# Patient Record
Sex: Female | Born: 1950 | Race: White | Hispanic: No | Smoking: Former smoker
Health system: Southern US, Community
[De-identification: ages and names within clinical notes are randomized; demographics above are authoritative.]

## PROBLEM LIST (undated history)

## (undated) DIAGNOSIS — E039 Hypothyroidism, unspecified: Secondary | ICD-10-CM

## (undated) DIAGNOSIS — R202 Paresthesia of skin: Secondary | ICD-10-CM

## (undated) DIAGNOSIS — R918 Other nonspecific abnormal finding of lung field: Secondary | ICD-10-CM

## (undated) DIAGNOSIS — R7309 Other abnormal glucose: Secondary | ICD-10-CM

## (undated) DIAGNOSIS — E785 Hyperlipidemia, unspecified: Secondary | ICD-10-CM

## (undated) DIAGNOSIS — I10 Essential (primary) hypertension: Secondary | ICD-10-CM

## (undated) DIAGNOSIS — E7800 Pure hypercholesterolemia, unspecified: Secondary | ICD-10-CM

## (undated) DIAGNOSIS — M810 Age-related osteoporosis without current pathological fracture: Secondary | ICD-10-CM

## (undated) HISTORY — DX: Other abnormal glucose: R73.09

## (undated) HISTORY — DX: Other nonspecific abnormal finding of lung field: R91.8

## (undated) HISTORY — DX: Hyperlipidemia, unspecified: E78.5

## (undated) HISTORY — DX: Paresthesia of skin: R20.2

## (undated) HISTORY — DX: Hypothyroidism, unspecified: E03.9

---

## 2018-06-17 ENCOUNTER — Encounter (HOSPITAL_COMMUNITY): Payer: Self-pay | Admitting: *Deleted

## 2018-06-17 ENCOUNTER — Other Ambulatory Visit: Payer: Self-pay

## 2018-06-17 ENCOUNTER — Emergency Department (HOSPITAL_COMMUNITY): Payer: Medicare Other

## 2018-06-17 ENCOUNTER — Emergency Department (HOSPITAL_COMMUNITY)
Admission: EM | Admit: 2018-06-17 | Discharge: 2018-06-17 | Disposition: A | Discharge status: Home / Self Care | Payer: Medicare Other | Attending: Emergency Medicine | Admitting: Emergency Medicine

## 2018-06-17 DIAGNOSIS — Z87891 Personal history of nicotine dependence: Secondary | ICD-10-CM | POA: Insufficient documentation

## 2018-06-17 DIAGNOSIS — Z8639 Personal history of other endocrine, nutritional and metabolic disease: Secondary | ICD-10-CM | POA: Diagnosis not present

## 2018-06-17 DIAGNOSIS — Z79899 Other long term (current) drug therapy: Secondary | ICD-10-CM | POA: Insufficient documentation

## 2018-06-17 DIAGNOSIS — I10 Essential (primary) hypertension: Secondary | ICD-10-CM | POA: Insufficient documentation

## 2018-06-17 DIAGNOSIS — H538 Other visual disturbances: Secondary | ICD-10-CM | POA: Diagnosis present

## 2018-06-17 DIAGNOSIS — H539 Unspecified visual disturbance: Secondary | ICD-10-CM

## 2018-06-17 HISTORY — DX: Age-related osteoporosis without current pathological fracture: M81.0

## 2018-06-17 HISTORY — DX: Pure hypercholesterolemia, unspecified: E78.00

## 2018-06-17 HISTORY — DX: Essential (primary) hypertension: I10

## 2018-06-17 LAB — COMPREHENSIVE METABOLIC PANEL
ALT: 20 U/L (ref 0–44)
ANION GAP: 11 (ref 5–15)
AST: 26 U/L (ref 15–41)
Albumin: 4.2 g/dL (ref 3.5–5.0)
Alkaline Phosphatase: 49 U/L (ref 38–126)
BUN: 18 mg/dL (ref 8–23)
CHLORIDE: 103 mmol/L (ref 98–111)
CO2: 27 mmol/L (ref 22–32)
Calcium: 9.2 mg/dL (ref 8.9–10.3)
Creatinine, Ser: 0.92 mg/dL (ref 0.44–1.00)
Glucose, Bld: 91 mg/dL (ref 70–99)
POTASSIUM: 3.2 mmol/L — AB (ref 3.5–5.1)
Sodium: 141 mmol/L (ref 135–145)
TOTAL PROTEIN: 7.1 g/dL (ref 6.5–8.1)
Total Bilirubin: 0.6 mg/dL (ref 0.3–1.2)

## 2018-06-17 LAB — DIFFERENTIAL
BASOS ABS: 0 10*3/uL (ref 0.0–0.1)
BASOS PCT: 0 %
EOS ABS: 0 10*3/uL (ref 0.0–0.7)
EOS PCT: 1 %
Lymphocytes Relative: 35 %
Lymphs Abs: 2.5 10*3/uL (ref 0.7–4.0)
MONO ABS: 0.5 10*3/uL (ref 0.1–1.0)
MONOS PCT: 7 %
Neutro Abs: 4.1 10*3/uL (ref 1.7–7.7)
Neutrophils Relative %: 57 %

## 2018-06-17 LAB — CBC
HEMATOCRIT: 40.3 % (ref 36.0–46.0)
Hemoglobin: 14.4 g/dL (ref 12.0–15.0)
MCH: 31.7 pg (ref 26.0–34.0)
MCHC: 35.7 g/dL (ref 30.0–36.0)
MCV: 88.8 fL (ref 78.0–100.0)
Platelets: 242 10*3/uL (ref 150–400)
RBC: 4.54 MIL/uL (ref 3.87–5.11)
RDW: 12.4 % (ref 11.5–15.5)
WBC: 7.1 10*3/uL (ref 4.0–10.5)

## 2018-06-17 LAB — I-STAT TROPONIN, ED: TROPONIN I, POC: 0 ng/mL (ref 0.00–0.08)

## 2018-06-17 LAB — PROTIME-INR
INR: 0.94
Prothrombin Time: 12.5 seconds (ref 11.4–15.2)

## 2018-06-17 LAB — APTT: APTT: 29 s (ref 24–36)

## 2018-06-17 MED ORDER — SODIUM CHLORIDE 0.9 % IV BOLUS
500.0000 mL | Freq: Once | INTRAVENOUS | Status: AC
  Administered 2018-06-17: 500 mL via INTRAVENOUS

## 2018-06-17 MED ORDER — SODIUM CHLORIDE 0.9 % IV SOLN
100.0000 mL/h | INTRAVENOUS | Status: DC
  Administered 2018-06-17: 100 mL/h via INTRAVENOUS

## 2018-06-17 NOTE — ED Triage Notes (Signed)
Pt stated "Around 3 pm, I bent over and when I raised back up my vision was blurred.  I took my b/p, it was high, so I took it again and it was normal."  Pt denies LOC, numbness, tingling, or extremity deficits.  Facial symmetry normal.

## 2018-06-17 NOTE — ED Notes (Signed)
Patient transported to X-ray 

## 2018-06-17 NOTE — Discharge Instructions (Addendum)
As discussed, today's evaluation has been generally reassuring, but with consideration of possible changes in your condition, it is important he follow-up with your physician, via telephone tomorrow to arrange outpatient studies to minimize stroke risk.  These may include echocardiogram and carotid ultrasound.  In the interim, please take aspirin, 81 mg, daily.  Return here for concerning changes in your condition.

## 2018-06-17 NOTE — ED Provider Notes (Signed)
[facility]-EMERGENCY DEPT Provider Note   CSN: [id number] Arrival date & time: 06/17/18  1904     History   Chief Complaint Chief Complaint  Patient presents with  . visual changes    HPI Kari Lambert is a 67 y.o. female.  HPI   Patient presents after 2 episodes of vision changes. Patient has no history of stroke, MI, has had no prior CT, nor carotid ultrasound. Today, after bending over to perform some work, she felt onset of blurry vision, when she returned to upright positioning. This lasted a few moments, resolved. Afterwards she had a brief sensation of left peripheral blurry vision as well, this also resolved prior to ED arrival. Beyond the positioning, there was no clear precipitant for these changes, and she denies any other concurrent changes from baseline including weakness in her extremities, confusion, nausea. 1 recent medication change, initiation of anti-cholesterol medication, otherwise no medicine, diet, activity changes. 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 She is here with her mother who assists with the HPI.  Past Medical History:  Diagnosis Date  . High cholesterol   . Hypertension   . Osteoporosis     There are no active problems to display for this patient.   History reviewed. No pertinent surgical history.   OB History   None      Home Medications    Prior to Admission medications   Medication Sig Start Date End Date Taking? Authorizing Provider  alendronate (FOSAMAX) 70 MG tablet Take 70 mg by mouth every Saturday. 05/20/15  Yes [provider]  hydrochlorothiazide (HYDRODIURIL) 25 MG tablet Take 25 mg by mouth daily. 05/25/15  Yes [provider]  Multiple Vitamins-Minerals (CENTRUM SILVER PO) Take 1 tablet by mouth at bedtime.   Yes [provider]  potassium chloride (K-DUR) 10 MEQ tablet Take 10 mEq by mouth 2 (two)  times daily. 05/15/18  Yes [provider]  rosuvastatin (CRESTOR) 10 MG tablet Take 10 mg by mouth daily. 06/15/18  Yes [provider]    Family History No family history on file.  Social History Social History   Tobacco Use  . Smoking status: Former Smoker  Substance Use Topics  . Alcohol use: Yes    Alcohol/week: 7.0 standard drinks    Types: 7 Glasses of wine per week  . Drug use: Never     Allergies   Contrast media  [iodinated diagnostic agents]   Review of Systems Review of Systems  Constitutional:       Per HPI, otherwise negative  HENT:       Per HPI, otherwise negative  Eyes: Positive for visual disturbance.  Respiratory:       Per HPI, otherwise negative  Cardiovascular:       Per HPI, otherwise negative  Gastrointestinal: Negative for vomiting.  Endocrine:       Negative aside from HPI  Genitourinary:       Neg aside from HPI   Musculoskeletal:       Per HPI, otherwise negative  Skin: Negative.   Neurological: Negative for syncope.     Physical Exam Updated Vital Signs BP (!) 153/88 (BP Location: Left Arm)   Pulse 75   Temp 98.2 F (36.8 C)   Resp 15   Ht 5\' 4"  (1.626 m)   Wt 67.6 kg   SpO2 100%   BMI 25.58 kg/m   Physical Exam  Constitutional: She  is oriented to person, place, and time. She appears well-developed and well-nourished. No distress.  HENT:  Head: Normocephalic and atraumatic.  Eyes: Conjunctivae and EOM are normal.  Cardiovascular: Normal rate and regular rhythm.  Pulmonary/Chest: Effort normal and breath sounds normal. No stridor. No respiratory distress.  Abdominal: She exhibits no distension.  Musculoskeletal: She exhibits no edema.  Neurological: She is alert and oriented to person, place, and time. She displays no atrophy and no tremor. No cranial nerve deficit or sensory deficit. She exhibits normal muscle tone. She displays no seizure activity. Coordination normal.  Skin: Skin is warm and dry.    Psychiatric: She has a normal mood and affect.  Nursing note and vitals reviewed.    ED Treatments / Results  Labs (all labs ordered are listed, but only abnormal results are displayed) Labs Reviewed  COMPREHENSIVE METABOLIC PANEL - Abnormal; Notable for the following components:      Result Value   Potassium 3.2 (*)    All other components within normal limits  PROTIME-INR  APTT  CBC  DIFFERENTIAL  RAPID URINE DRUG SCREEN, HOSP PERFORMED  URINALYSIS, ROUTINE W REFLEX MICROSCOPIC  I-STAT TROPONIN, ED    EKG EKG Interpretation  Date/Time:  Sunday June 17 2018 19:11:13 EDT Ventricular Rate:  68 PR Interval:    QRS Duration: 101 QT Interval:  391 QTC Calculation: 416 R Axis:   67 Text Interpretation:  Sinus rhythm Short PR interval Low voltage, extremity and precordial leads Artifact ST-t wave abnormality Abnormal ekg Confirmed by Gerhard MunchLockwood, Ami Thornsberry [id number]) on 06/17/2018 7:20:44 PM   Radiology Dg Chest 2 View  Result Date: 06/17/2018 CLINICAL DATA:  Cough, altered mental status EXAM: CHEST - 2 VIEW COMPARISON:  None. FINDINGS: The heart size and mediastinal contours are within normal limits. Both lungs are clear. The visualized skeletal structures are unremarkable. IMPRESSION: No active cardiopulmonary disease. Electronically Signed   By: Jasmine PangKim  Fujinaga M.D.   On: 06/17/2018 20:53   Ct Head Wo Contrast  Result Date: 06/17/2018 CLINICAL DATA:  Hypertension with blurred vision EXAM: CT HEAD WITHOUT CONTRAST TECHNIQUE: Contiguous axial images were obtained from the base of the skull through the vertex without intravenous contrast. COMPARISON:  None. FINDINGS: Brain: There is age related volume loss. There is no intracranial mass, hemorrhage, extra-axial fluid collection, or midline shift. Gray-white compartments appear normal. No evident acute infarct. Vascular: No evident hyperdense vessel. There is slight calcification in the distal left cavernous carotid artery. Skull: Bony  calvarium appears intact. Sinuses/Orbits: There is slight mucosal thickening in several ethmoid air cells. Other visualized paranasal sinuses are clear. There is rightward deviation of the nasal septum. Orbits bilaterally appear symmetric. Other: Mastoid air cells are clear. IMPRESSION: Age related volume loss. Gray-white compartments are normal. No mass or hemorrhage. There is slight arterial vascular calcification. There is mucosal thickening in several ethmoid air cells. There is rightward deviation of the nasal septum. Electronically Signed   By: Bretta BangWilliam  Woodruff III M.D.   On: 06/17/2018 21:20    Procedures Procedures (including critical care time)  Medications Ordered in ED Medications  sodium chloride 0.9 % bolus 500 mL (500 mLs Intravenous New Bag/Given 06/17/18 2046)    Followed by  0.9 %  sodium chloride infusion (100 mL/hr Intravenous New Bag/Given 06/17/18 2049)     Initial Impression / Assessment and Plan / ED Course  I have reviewed the triage vital signs and the nursing notes.  Pertinent labs & imaging results that were available during my care  of the patient were reviewed by me and considered in my medical decision making (see chart for details).     9:50 PM Awake, alert, in no distress, no recurrence of her vision changes. With a lengthy conversation about today's evaluation including reassuring CT scan, x-ray, labs. No evidence for ongoing ischemia, no evidence for stroke. Patient does have some age-appropriate changes.  We discussed today's evaluation as a step for stroke evaluation, and need for ongoing identification of stroke risk factors, which could be done as an outpatient or with escalation for possible consideration of TIA. This is a low suspicion, and symptoms are more likely due to the patient's positioning, however, with no prior evaluation including carotid Dopplers, echo, patient will follow up with her primary care physician to perform these studies. In the  interim, patient will start low-dose aspirin.   Final Clinical Impressions(s) / ED Diagnoses  Visual disturbance   Gerhard MunchLockwood, [place] Howse, MD 06/17/18 2151

## 2018-06-21 ENCOUNTER — Other Ambulatory Visit (HOSPITAL_COMMUNITY): Payer: Self-pay | Admitting: Family Medicine

## 2018-06-21 DIAGNOSIS — R42 Dizziness and giddiness: Secondary | ICD-10-CM

## 2018-06-22 ENCOUNTER — Other Ambulatory Visit: Payer: Self-pay | Admitting: Family Medicine

## 2018-06-22 DIAGNOSIS — R42 Dizziness and giddiness: Secondary | ICD-10-CM

## 2018-06-26 ENCOUNTER — Other Ambulatory Visit: Payer: Self-pay

## 2018-06-26 ENCOUNTER — Ambulatory Visit (HOSPITAL_COMMUNITY): Payer: Medicare Other | Attending: Cardiovascular Disease

## 2018-06-26 DIAGNOSIS — E785 Hyperlipidemia, unspecified: Secondary | ICD-10-CM | POA: Insufficient documentation

## 2018-06-26 DIAGNOSIS — R42 Dizziness and giddiness: Secondary | ICD-10-CM | POA: Insufficient documentation

## 2018-06-26 DIAGNOSIS — I10 Essential (primary) hypertension: Secondary | ICD-10-CM | POA: Diagnosis not present

## 2018-06-27 ENCOUNTER — Ambulatory Visit
Admission: RE | Admit: 2018-06-27 | Discharge: 2018-06-27 | Disposition: A | Discharge status: Home / Self Care | Payer: Medicare Other | Source: Ambulatory Visit | Attending: Family Medicine | Admitting: Family Medicine

## 2018-06-27 DIAGNOSIS — R42 Dizziness and giddiness: Secondary | ICD-10-CM

## 2019-05-09 IMAGING — US US CAROTID DUPLEX BILAT
1 series · 13 of 24 positions shown · non-contrast
Comparison: Head CT 06/17/2018

CLINICAL DATA: 66-year-old female with dizziness

EXAM:
BILATERAL CAROTID DUPLEX ULTRASOUND
TECHNIQUE: Gray scale imaging, color Doppler and duplex ultrasound were
performed of bilateral carotid and vertebral arteries in the neck.

[Series 1: us carotid duplex bilat · 0.06mm/px · 13 of 68 slices shown]
[im 1/68]
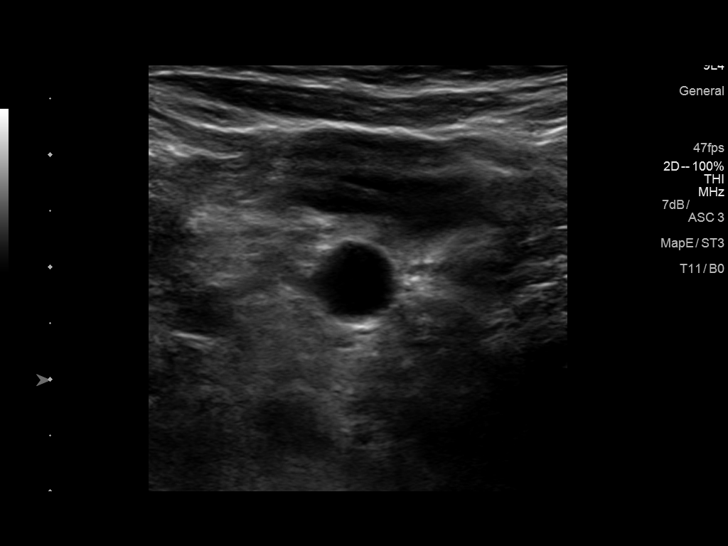
[im 6/68]
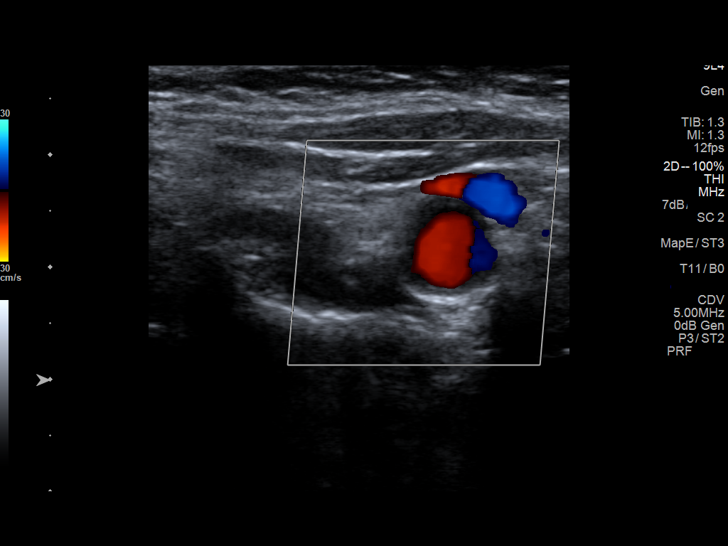
[im 12/68]
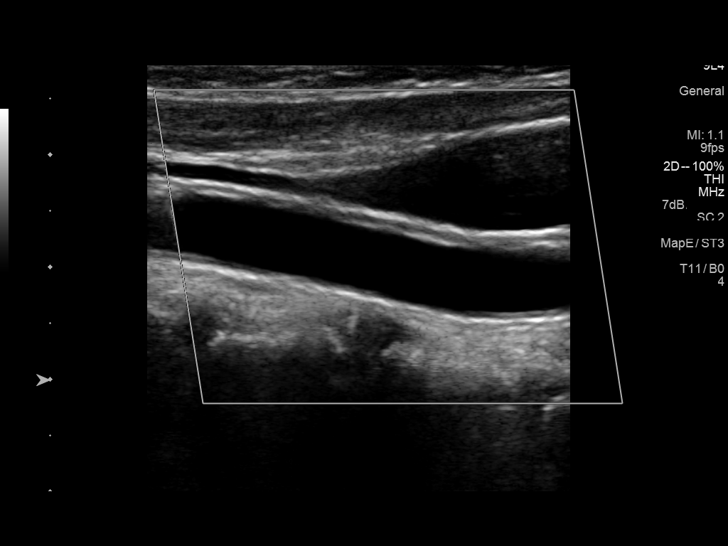
[im 18/68]
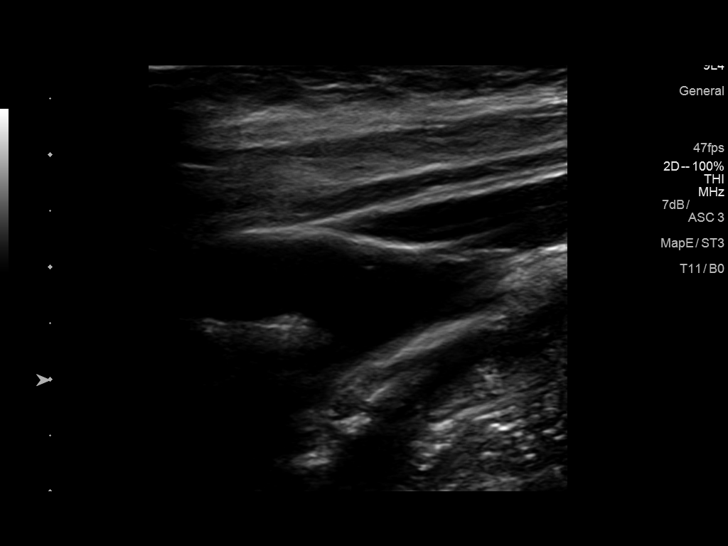
[im 24/68]
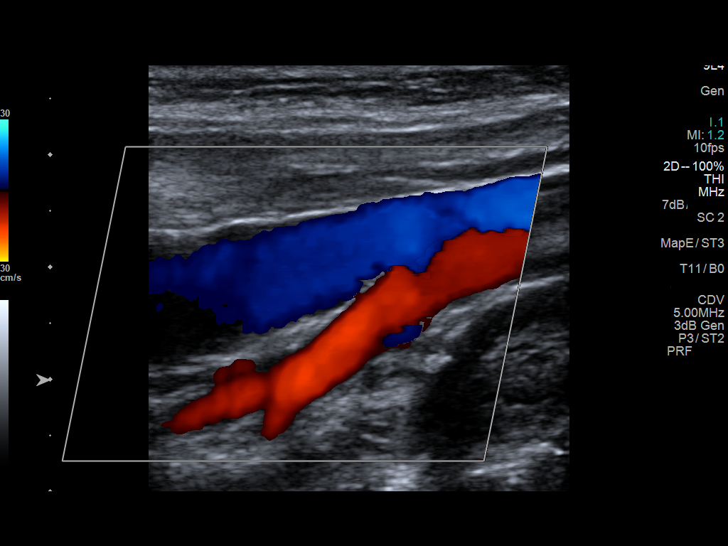
[im 30/68]
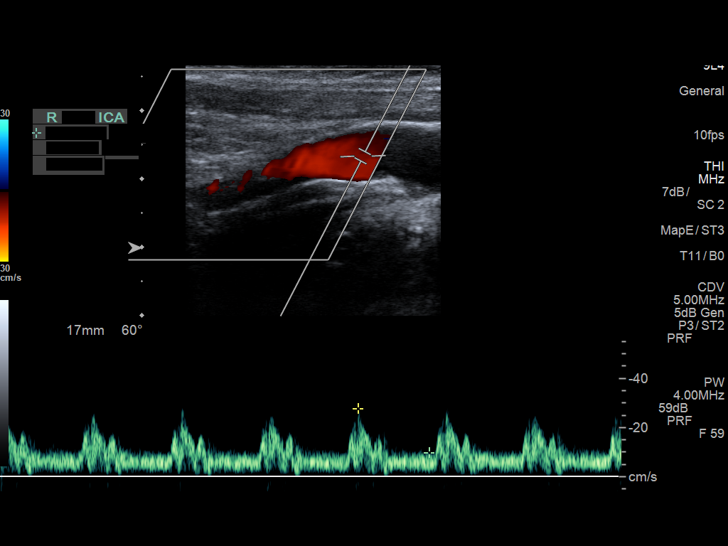
[im 35/68]
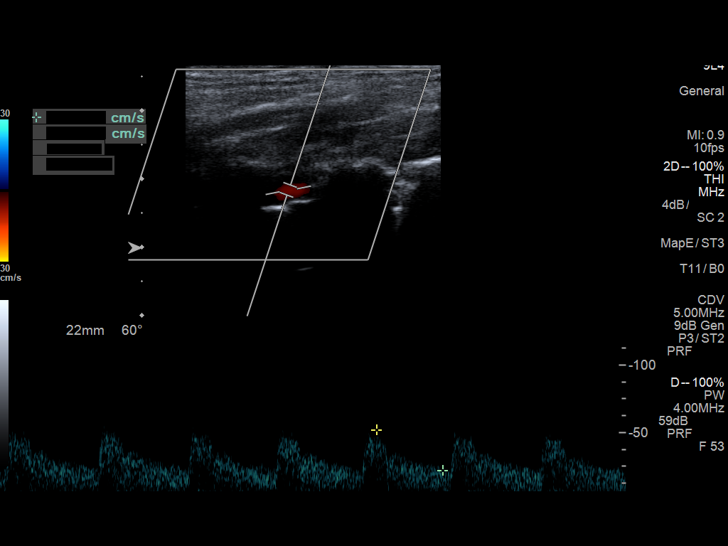
[im 38/68]
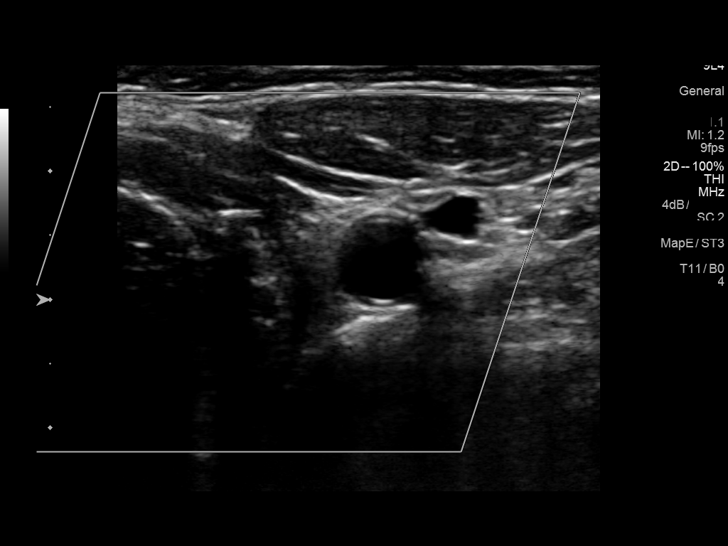
[im 44/68]
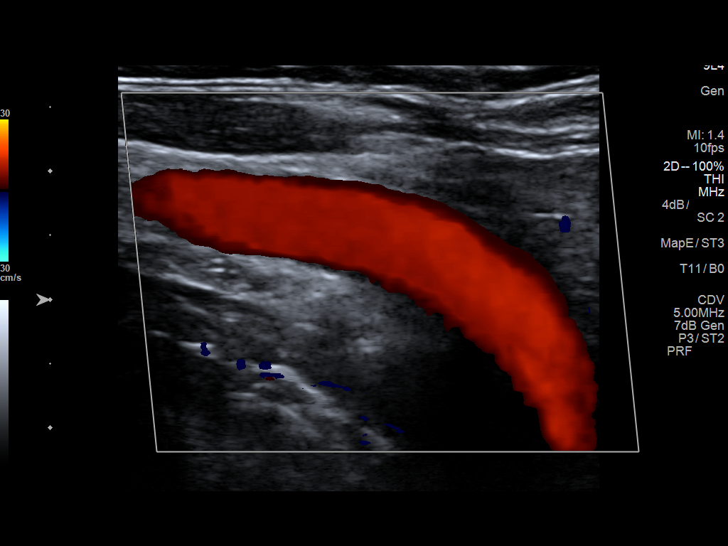
[im 50/68]
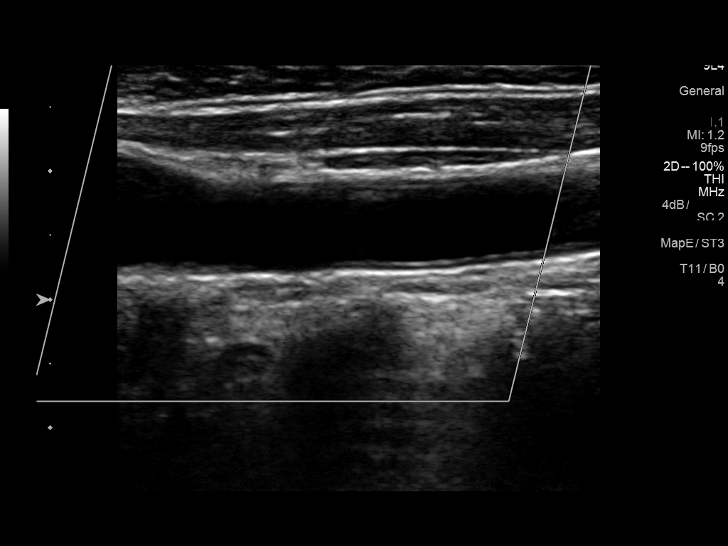
[im 56/68]
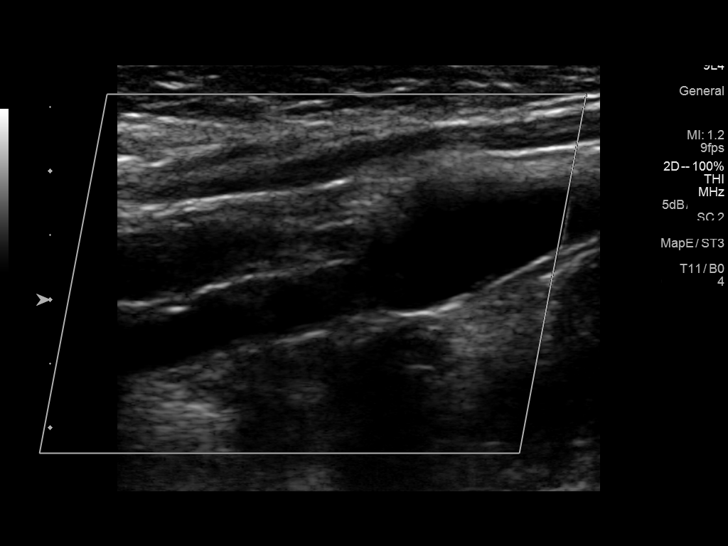
[im 62/68]
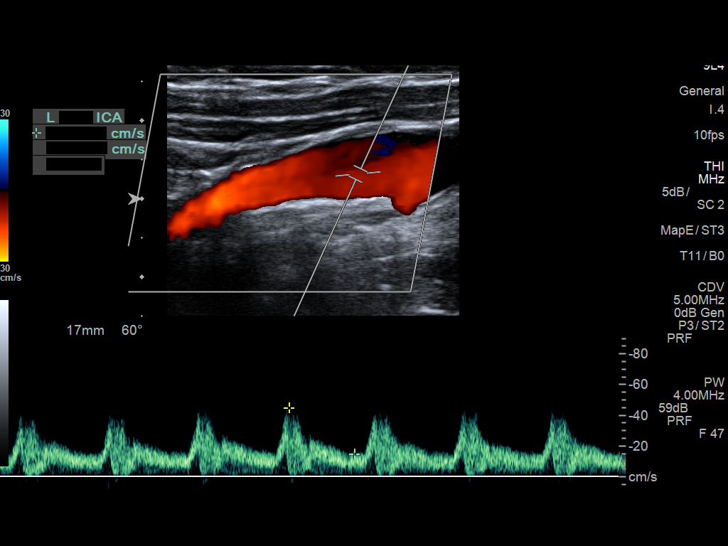
[im 68/68]
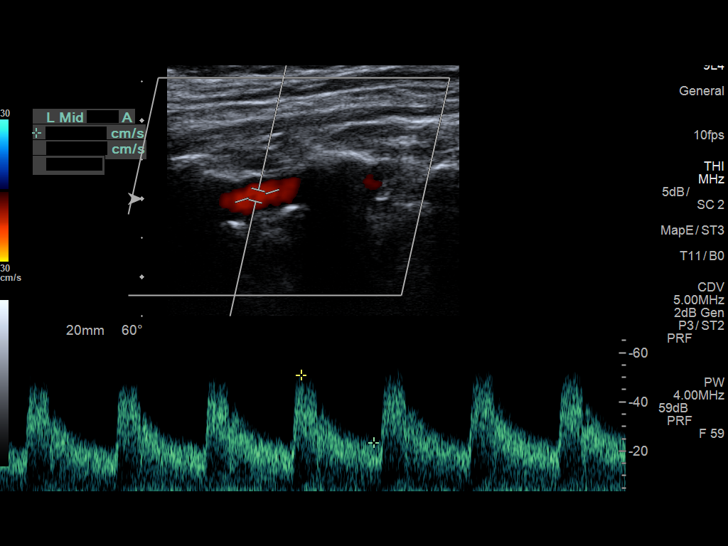

[13 of 24 positions shown; findings below may reference images not displayed]

FINDINGS: Criteria: Quantification of carotid stenosis is based on velocity
parameters that correlate the residual internal carotid diameter
with NASCET-based stenosis levels, using the diameter of the distal
internal carotid lumen as the denominator for stenosis measurement.

The following velocity measurements were obtained:

RIGHT
ICA: 79/44 cm/sec
CCA: 91/21 cm/sec

SYSTOLIC ICA/CCA RATIO:

ECA:  55 cm/sec

LEFT

ICA: 82/38 cm/sec

CCA: 89/22 cm/sec

SYSTOLIC ICA/CCA RATIO:

ECA:  59 cm/sec

RIGHT CAROTID ARTERY: Mild smooth hypoechoic atherosclerotic plaque
in the proximal internal carotid artery. By peak systolic velocity
criteria, the estimated stenosis remains less than 50%.

RIGHT VERTEBRAL ARTERY:  Patent with normal antegrade flow.

LEFT CAROTID ARTERY: No significant atherosclerotic plaque or
evidence of stenosis.

LEFT VERTEBRAL ARTERY:  Patent with normal antegrade flow.
IMPRESSION: 1. Mild (1-49%) stenosis proximal right internal carotid artery
secondary to mild smooth hypoechoic (lipid rich) atherosclerotic
plaque.
2. No significant atherosclerotic plaque or evidence of stenosis in
the left internal carotid artery.
3. Vertebral arteries are patent with normal antegrade flow.

## 2020-05-28 IMAGING — CT CT HEAD W/O CM
3 series · 14 of 47 positions shown, 16 images · non-contrast
Comparison: None.

CLINICAL DATA: Hypertension with blurred vision

EXAM:
CT HEAD WITHOUT CONTRAST
TECHNIQUE: Contiguous axial images were obtained from the base of the skull
through the vertex without intravenous contrast.

[Series 2: head wo · axial · 0.47mm/px · z∈[+31,+166]mm · 8 of 33 slices shown, 10 images]
[im 3/33  brain]
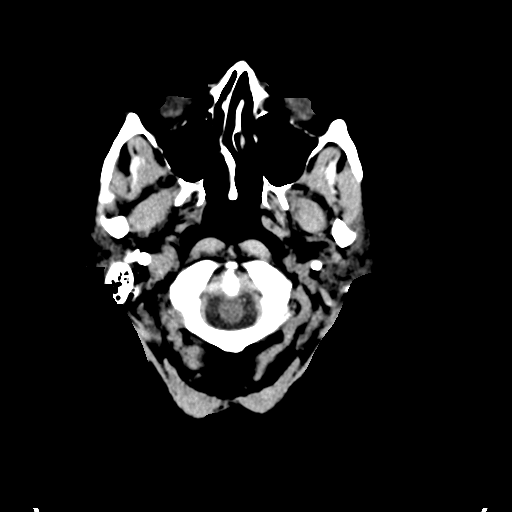
[im 3/33  bone]
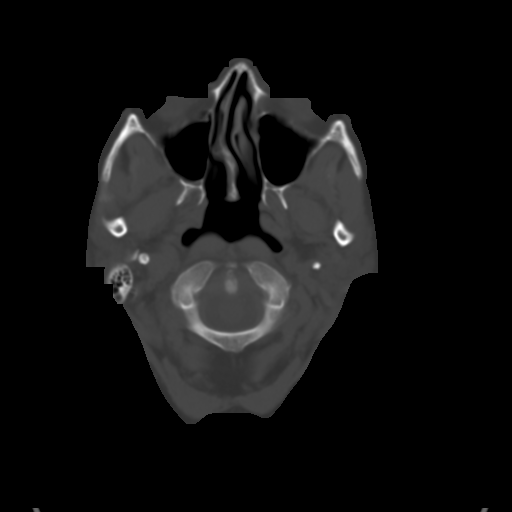
[im 7/33  brain]
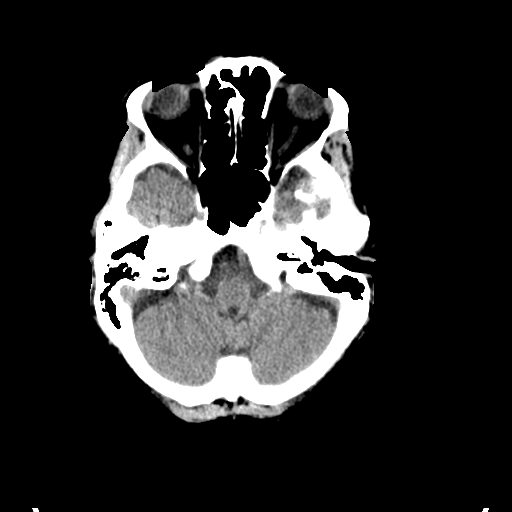
[im 10/33  brain]
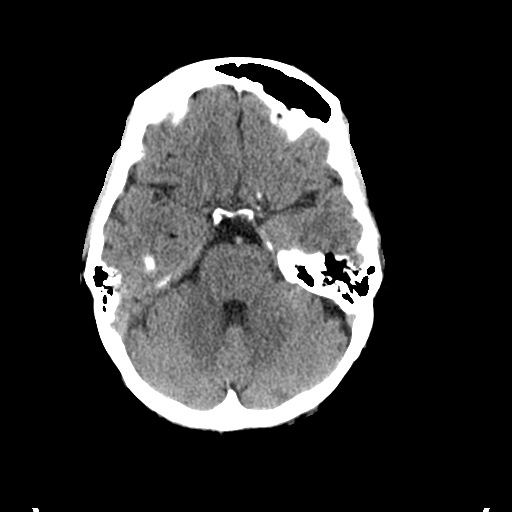
[im 15/33  brain]
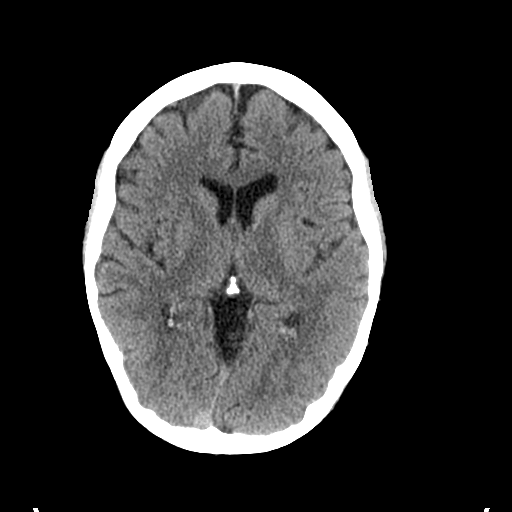
[im 18/33  brain]
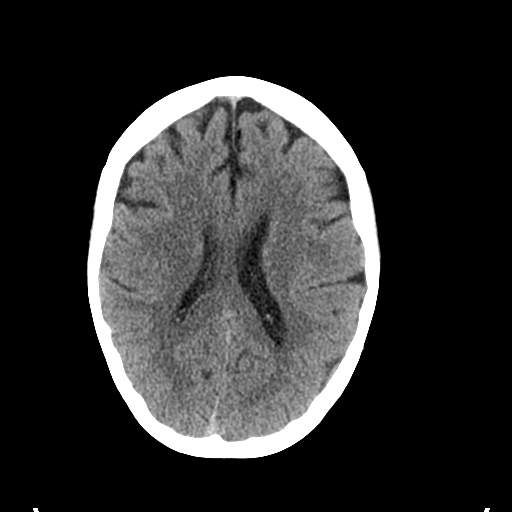
[im 18/33  bone]
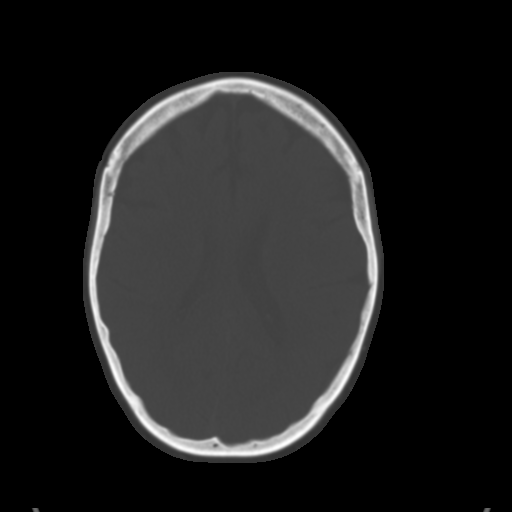
[im 23/33  brain]
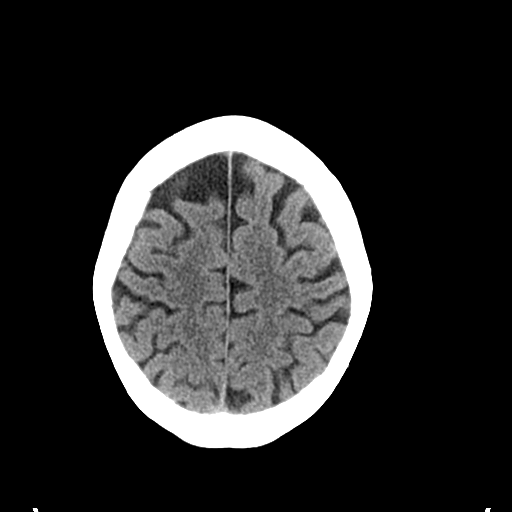
[im 26/33  brain]
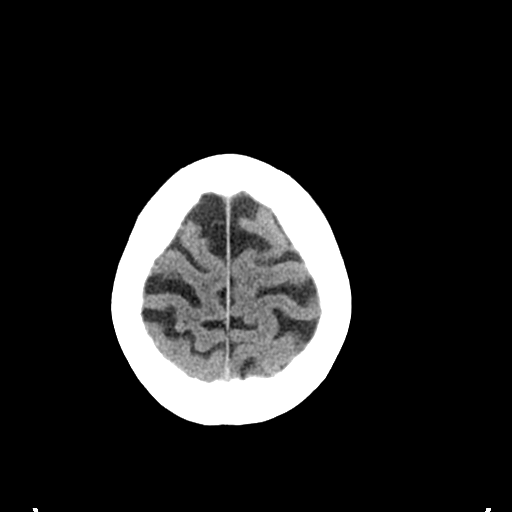
[im 30/33  brain]
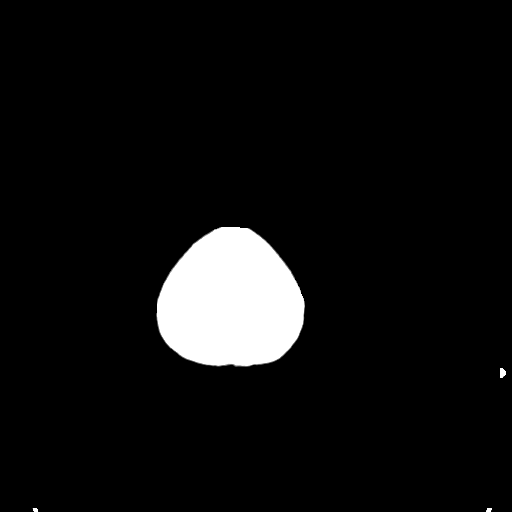

[Series 4: coronal soft tissue · coronal · 0.32mm/px · 3 of 84 slices shown]
[im 28/84  brain]
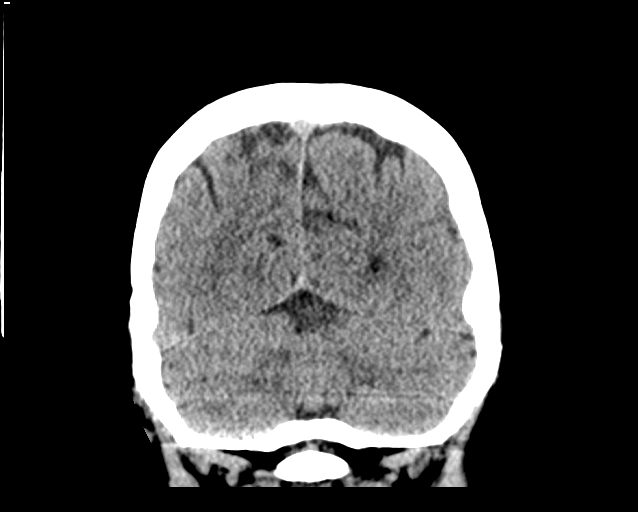
[im 37/84  brain]
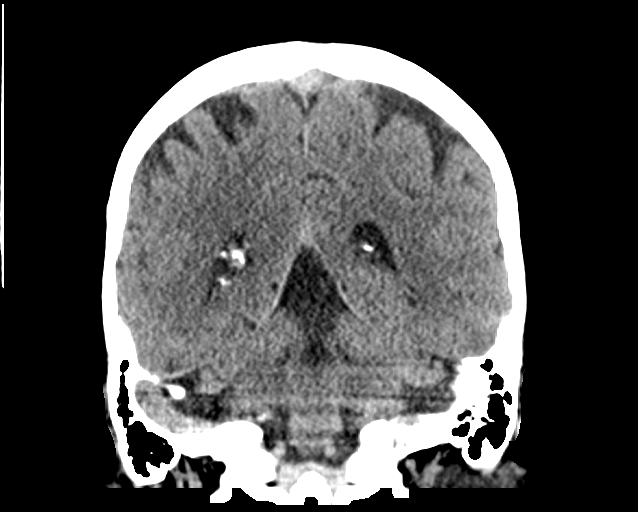
[im 47/84  brain]
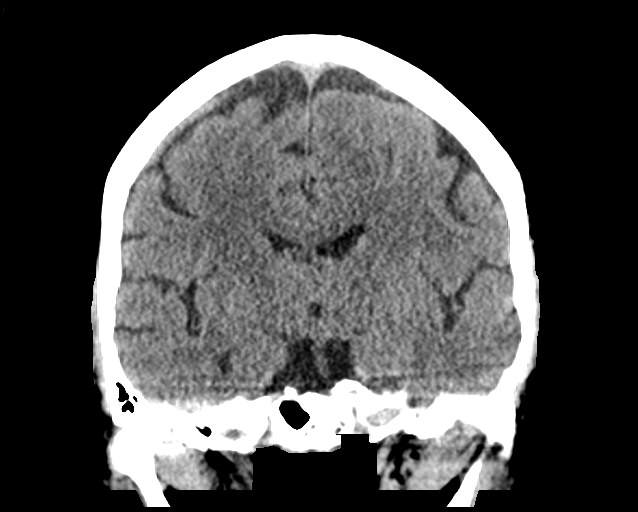

[Series 5: sagittal soft tissue · sagittal · 0.32mm/px · 3 of 70 slices shown]
[im 24/70  brain]
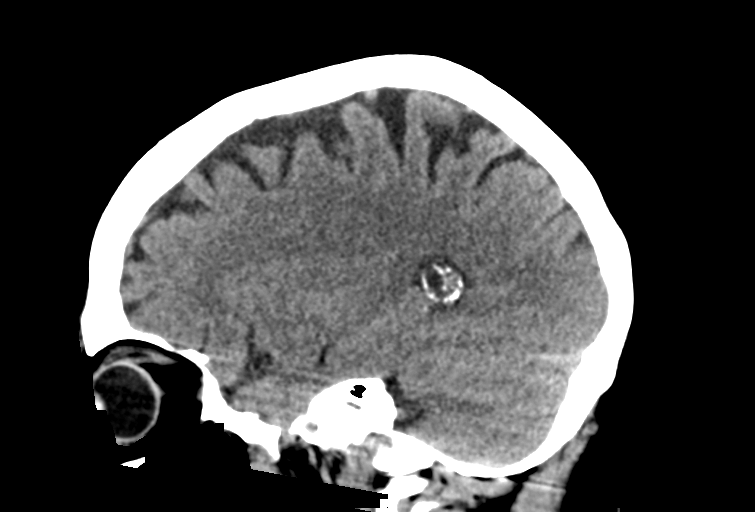
[im 35/70  brain]
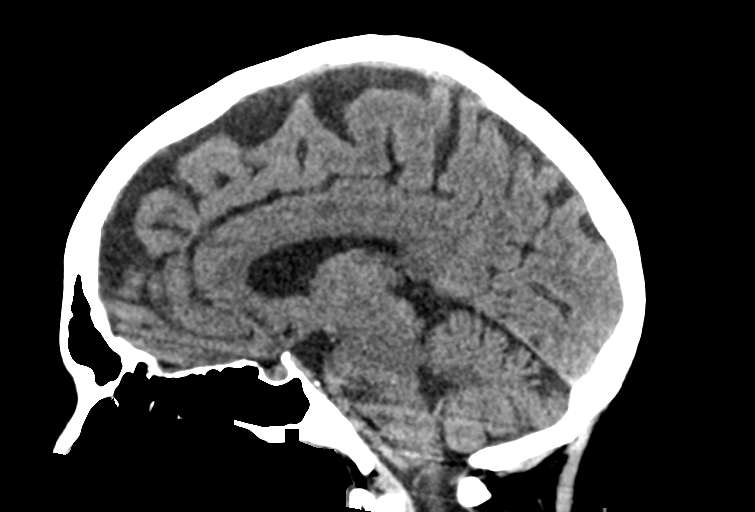
[im 47/70  brain]
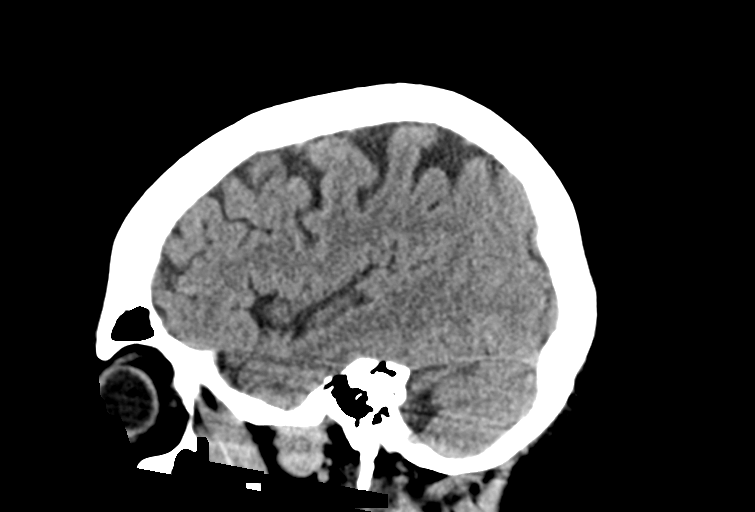

[14 of 47 positions shown; findings below may reference images not displayed]

FINDINGS: Brain: There is age related volume loss. There is no intracranial
mass, hemorrhage, extra-axial fluid collection, or midline shift.
Gray-white compartments appear normal. No evident acute infarct.

Vascular: No evident hyperdense vessel. There is slight
calcification in the distal left cavernous carotid artery.

Skull: Bony calvarium appears intact.

Sinuses/Orbits: There is slight mucosal thickening in several
ethmoid air cells. Other visualized paranasal sinuses are clear.
There is rightward deviation of the nasal septum. Orbits bilaterally
appear symmetric.

Other: Mastoid air cells are clear.
IMPRESSION: Age related volume loss. Gray-white compartments are normal. No mass
or hemorrhage.

There is slight arterial vascular calcification. There is mucosal
thickening in several ethmoid air cells. There is rightward
deviation of the nasal septum.

## 2020-07-15 DIAGNOSIS — H40013 Open angle with borderline findings, low risk, bilateral: Secondary | ICD-10-CM | POA: Diagnosis not present

## 2020-07-15 DIAGNOSIS — H04123 Dry eye syndrome of bilateral lacrimal glands: Secondary | ICD-10-CM | POA: Diagnosis not present

## 2020-07-15 DIAGNOSIS — H25013 Cortical age-related cataract, bilateral: Secondary | ICD-10-CM | POA: Diagnosis not present

## 2020-07-15 DIAGNOSIS — H2513 Age-related nuclear cataract, bilateral: Secondary | ICD-10-CM | POA: Diagnosis not present

## 2020-07-21 DIAGNOSIS — E785 Hyperlipidemia, unspecified: Secondary | ICD-10-CM | POA: Diagnosis not present

## 2020-07-21 DIAGNOSIS — Z23 Encounter for immunization: Secondary | ICD-10-CM | POA: Diagnosis not present

## 2020-07-21 DIAGNOSIS — Z1283 Encounter for screening for malignant neoplasm of skin: Secondary | ICD-10-CM | POA: Diagnosis not present

## 2020-07-21 DIAGNOSIS — L989 Disorder of the skin and subcutaneous tissue, unspecified: Secondary | ICD-10-CM | POA: Diagnosis not present

## 2020-07-21 DIAGNOSIS — R202 Paresthesia of skin: Secondary | ICD-10-CM | POA: Diagnosis not present

## 2020-07-21 DIAGNOSIS — M810 Age-related osteoporosis without current pathological fracture: Secondary | ICD-10-CM | POA: Diagnosis not present

## 2020-07-21 DIAGNOSIS — Z0000 Encounter for general adult medical examination without abnormal findings: Secondary | ICD-10-CM | POA: Diagnosis not present

## 2020-07-21 DIAGNOSIS — R7309 Other abnormal glucose: Secondary | ICD-10-CM | POA: Diagnosis not present

## 2020-07-21 DIAGNOSIS — I10 Essential (primary) hypertension: Secondary | ICD-10-CM | POA: Diagnosis not present

## 2020-08-03 DIAGNOSIS — N390 Urinary tract infection, site not specified: Secondary | ICD-10-CM | POA: Diagnosis not present

## 2020-08-06 DIAGNOSIS — Z1231 Encounter for screening mammogram for malignant neoplasm of breast: Secondary | ICD-10-CM | POA: Diagnosis not present

## 2020-08-11 DIAGNOSIS — R2231 Localized swelling, mass and lump, right upper limb: Secondary | ICD-10-CM | POA: Diagnosis not present

## 2020-09-14 DIAGNOSIS — M67441 Ganglion, right hand: Secondary | ICD-10-CM | POA: Diagnosis not present

## 2020-09-14 DIAGNOSIS — M25741 Osteophyte, right hand: Secondary | ICD-10-CM | POA: Diagnosis not present

## 2020-09-23 DIAGNOSIS — Z4789 Encounter for other orthopedic aftercare: Secondary | ICD-10-CM | POA: Diagnosis not present

## 2020-09-23 DIAGNOSIS — R2231 Localized swelling, mass and lump, right upper limb: Secondary | ICD-10-CM | POA: Diagnosis not present

## 2021-08-03 DIAGNOSIS — R946 Abnormal results of thyroid function studies: Secondary | ICD-10-CM | POA: Diagnosis not present

## 2021-08-03 DIAGNOSIS — R7309 Other abnormal glucose: Secondary | ICD-10-CM | POA: Diagnosis not present

## 2021-08-03 DIAGNOSIS — Z23 Encounter for immunization: Secondary | ICD-10-CM | POA: Diagnosis not present

## 2021-08-03 DIAGNOSIS — Z0000 Encounter for general adult medical examination without abnormal findings: Secondary | ICD-10-CM | POA: Diagnosis not present

## 2021-08-03 DIAGNOSIS — E785 Hyperlipidemia, unspecified: Secondary | ICD-10-CM | POA: Diagnosis not present

## 2021-08-03 DIAGNOSIS — M810 Age-related osteoporosis without current pathological fracture: Secondary | ICD-10-CM | POA: Diagnosis not present

## 2021-08-03 DIAGNOSIS — I10 Essential (primary) hypertension: Secondary | ICD-10-CM | POA: Diagnosis not present

## 2021-08-10 DIAGNOSIS — Z1231 Encounter for screening mammogram for malignant neoplasm of breast: Secondary | ICD-10-CM | POA: Diagnosis not present

## 2021-09-13 DIAGNOSIS — R300 Dysuria: Secondary | ICD-10-CM | POA: Diagnosis not present

## 2021-09-20 DIAGNOSIS — E039 Hypothyroidism, unspecified: Secondary | ICD-10-CM | POA: Diagnosis not present

## 2021-11-03 DIAGNOSIS — Z780 Asymptomatic menopausal state: Secondary | ICD-10-CM | POA: Diagnosis not present

## 2021-11-03 DIAGNOSIS — M8589 Other specified disorders of bone density and structure, multiple sites: Secondary | ICD-10-CM | POA: Diagnosis not present

## 2021-12-13 DIAGNOSIS — N390 Urinary tract infection, site not specified: Secondary | ICD-10-CM | POA: Diagnosis not present

## 2021-12-21 DIAGNOSIS — I788 Other diseases of capillaries: Secondary | ICD-10-CM | POA: Diagnosis not present

## 2021-12-21 DIAGNOSIS — D2262 Melanocytic nevi of left upper limb, including shoulder: Secondary | ICD-10-CM | POA: Diagnosis not present

## 2021-12-21 DIAGNOSIS — L821 Other seborrheic keratosis: Secondary | ICD-10-CM | POA: Diagnosis not present

## 2021-12-21 DIAGNOSIS — D1801 Hemangioma of skin and subcutaneous tissue: Secondary | ICD-10-CM | POA: Diagnosis not present

## 2021-12-21 DIAGNOSIS — I8391 Asymptomatic varicose veins of right lower extremity: Secondary | ICD-10-CM | POA: Diagnosis not present

## 2021-12-21 DIAGNOSIS — L400 Psoriasis vulgaris: Secondary | ICD-10-CM | POA: Diagnosis not present

## 2021-12-21 DIAGNOSIS — L570 Actinic keratosis: Secondary | ICD-10-CM | POA: Diagnosis not present

## 2021-12-27 DIAGNOSIS — N390 Urinary tract infection, site not specified: Secondary | ICD-10-CM | POA: Diagnosis not present

## 2022-03-04 DIAGNOSIS — L570 Actinic keratosis: Secondary | ICD-10-CM | POA: Diagnosis not present

## 2022-03-09 DIAGNOSIS — H524 Presbyopia: Secondary | ICD-10-CM | POA: Diagnosis not present

## 2022-03-09 DIAGNOSIS — H04123 Dry eye syndrome of bilateral lacrimal glands: Secondary | ICD-10-CM | POA: Diagnosis not present

## 2022-03-09 DIAGNOSIS — H43393 Other vitreous opacities, bilateral: Secondary | ICD-10-CM | POA: Diagnosis not present

## 2022-03-09 DIAGNOSIS — H25813 Combined forms of age-related cataract, bilateral: Secondary | ICD-10-CM | POA: Diagnosis not present

## 2022-03-09 DIAGNOSIS — H40013 Open angle with borderline findings, low risk, bilateral: Secondary | ICD-10-CM | POA: Diagnosis not present

## 2022-06-22 DIAGNOSIS — R5383 Other fatigue: Secondary | ICD-10-CM | POA: Diagnosis not present

## 2022-06-22 DIAGNOSIS — U071 COVID-19: Secondary | ICD-10-CM | POA: Diagnosis not present

## 2022-06-22 DIAGNOSIS — R509 Fever, unspecified: Secondary | ICD-10-CM | POA: Diagnosis not present

## 2022-08-18 DIAGNOSIS — I10 Essential (primary) hypertension: Secondary | ICD-10-CM | POA: Diagnosis not present

## 2022-08-18 DIAGNOSIS — Z23 Encounter for immunization: Secondary | ICD-10-CM | POA: Diagnosis not present

## 2022-08-18 DIAGNOSIS — Z5181 Encounter for therapeutic drug level monitoring: Secondary | ICD-10-CM | POA: Diagnosis not present

## 2022-08-18 DIAGNOSIS — E785 Hyperlipidemia, unspecified: Secondary | ICD-10-CM | POA: Diagnosis not present

## 2022-08-18 DIAGNOSIS — R7309 Other abnormal glucose: Secondary | ICD-10-CM | POA: Diagnosis not present

## 2022-08-18 DIAGNOSIS — M810 Age-related osteoporosis without current pathological fracture: Secondary | ICD-10-CM | POA: Diagnosis not present

## 2022-08-18 DIAGNOSIS — Z0000 Encounter for general adult medical examination without abnormal findings: Secondary | ICD-10-CM | POA: Diagnosis not present

## 2022-08-18 DIAGNOSIS — E039 Hypothyroidism, unspecified: Secondary | ICD-10-CM | POA: Diagnosis not present

## 2022-08-30 ENCOUNTER — Other Ambulatory Visit (HOSPITAL_COMMUNITY): Payer: Self-pay | Admitting: Family Medicine

## 2022-08-30 DIAGNOSIS — E785 Hyperlipidemia, unspecified: Secondary | ICD-10-CM

## 2022-08-30 DIAGNOSIS — Z1231 Encounter for screening mammogram for malignant neoplasm of breast: Secondary | ICD-10-CM | POA: Diagnosis not present

## 2022-09-12 ENCOUNTER — Ambulatory Visit (HOSPITAL_COMMUNITY)
Admission: RE | Admit: 2022-09-12 | Discharge: 2022-09-12 | Disposition: A | Discharge status: Home / Self Care | Payer: Medicare Other | Source: Ambulatory Visit | Attending: Family Medicine | Admitting: Family Medicine

## 2022-09-12 DIAGNOSIS — E785 Hyperlipidemia, unspecified: Secondary | ICD-10-CM | POA: Insufficient documentation

## 2023-01-10 ENCOUNTER — Ambulatory Visit: Payer: Medicare PPO | Admitting: Interventional Cardiology

## 2023-01-14 NOTE — Progress Notes (Unsigned)
Cardiology Office Note:   Date:  01/17/2023  ID:  Kari Lambert, DOB 06-01-1951, MRN [id number]  History of Present Illness:   Kari Lambert is a 72 y.o. female HTN and HLD who was referred by Dr. Orland Mustard for further evaluation of elevated Ca score.  Patient seen by Dr. Orland Mustard on 10/2022. Notes reviewed. Had Ca score 08/2022 which showed Ca score 176 (76%). Now referred to Cardiology for further management.   Today, the patient overall feels well. No chest pain, SOB, LE edema, orthopnea, or palpitations. States that she has a strong family history of CAD. Maternal GM with MI in her 67s. Father with history of CAD of CABG (in 56s) and passed from HF in his 58s. The patient is overall active and performs yoga 4-6x/week and walks and feels well with activity.  ROS: As per HPI  Studies Reviewed:    EKG:  NSR, HR 73. Personally reviewed.   Cardiac Studies & Procedures       ECHOCARDIOGRAM  ECHOCARDIOGRAM COMPLETE 06/26/2018  Narrative *[facility]* [address], [place] [address] [phone]  ------------------------------------------------------------------- Transthoracic Echocardiography  Patient:    Kari Lambert MR #:       [id number] Study Date: 06/26/2018 Gender:     F Age:        55 Height:     162.6 cm Weight:     67.6 kg BSA:        1.76 m^2 Pt. Status: Room:  SONOGRAPHER  Charlann Noss, RDCS ATTENDING    Sanda Klein, MD [facility], [place], [place] [phone] PERFORMING   [facility], Outpatient  cc:  ------------------------------------------------------------------- LV EF: 55% -   60%  ------------------------------------------------------------------- Indications:      Dizziness (R42).  ------------------------------------------------------------------- History:   PMH:  Acquired from the patient and from the patient&'s chart.  Lightheadedness.  Risk factors:   Hypertension. Dyslipidemia.  ------------------------------------------------------------------- Study Conclusions  - Left ventricle: The cavity size was normal. Systolic function was normal. The estimated ejection fraction was in the range of 55% to 60%. Wall motion was normal; there were no regional wall motion abnormalities. Doppler parameters are consistent with abnormal left ventricular relaxation (grade 1 diastolic dysfunction).  ------------------------------------------------------------------- Study data:  No prior study was available for comparison.  Study status:  Routine.  Procedure:  The patient reported no pain pre or post test. Transthoracic echocardiography. Image quality was adequate.  Study completion:  There were no complications. Transthoracic echocardiography.  M-mode, complete 2D, spectral Doppler, and color Doppler.  Birthdate:  Patient birthdate: 08-30-1951.  Age:  Patient is 72 yr old.  Sex:  Gender: female. BMI: 25.6 kg/m^2.  Blood pressure:     153/88  Patient status: Outpatient.  Study date:  Study date: 06/26/2018. Study time: 11:21 AM.  Location:  [facility]  -------------------------------------------------------------------  ------------------------------------------------------------------- Left ventricle:  The cavity size was normal. Systolic function was normal. The estimated ejection fraction was in the range of 55% to 60%. Wall motion was normal; there were no regional wall motion abnormalities. Doppler parameters are consistent with abnormal left ventricular relaxation (grade 1 diastolic dysfunction). There was no evidence of elevated ventricular filling pressure by Doppler parameters.  ------------------------------------------------------------------- Aortic valve:   Trileaflet; normal thickness leaflets. Mobility was not restricted.  Doppler:  Transvalvular velocity was within the normal range. There was no stenosis. There was no  regurgitation.  ------------------------------------------------------------------- Aorta:  Aortic root: The aortic root was normal  in size.  ------------------------------------------------------------------- Mitral valve:   Structurally normal valve.   Mobility was not restricted.  Doppler:  Transvalvular velocity was within the normal range. There was no evidence for stenosis. There was no regurgitation.  ------------------------------------------------------------------- Left atrium:  The atrium was normal in size.  ------------------------------------------------------------------- Right ventricle:  The cavity size was normal. Wall thickness was normal. Systolic function was normal.  ------------------------------------------------------------------- Pulmonic valve:   Poorly visualized.  Doppler:  Transvalvular velocity was within the normal range. There was no evidence for stenosis.  ------------------------------------------------------------------- Tricuspid valve:   Structurally normal valve.    Doppler: Transvalvular velocity was within the normal range. There was no regurgitation.  ------------------------------------------------------------------- Pulmonary artery:   The main pulmonary artery was normal-sized. Systolic pressure was within the normal range.  ------------------------------------------------------------------- Right atrium:  The atrium was normal in size.  ------------------------------------------------------------------- Pericardium:  There was no pericardial effusion.  ------------------------------------------------------------------- Systemic veins: Inferior vena cava: The vessel was normal in size. The respirophasic diameter changes were in the normal range (>= 50%), consistent with normal central venous pressure.  ------------------------------------------------------------------- Measurements  Left ventricle                          Value        Reference LV ID, ED, PLAX chordal        (L)     34.1  mm     43 - 52 LV ID, ES, PLAX chordal        (L)     20.3  mm     23 - 38 LV fx shortening, PLAX chordal         40    %      >=29 LV PW thickness, ED                    7.28  mm     ---------- IVS/LV PW ratio, ED            (H)     1.32         <=1.3 Stroke volume, 2D                      46    ml     ---------- Stroke volume/bsa, 2D                  26    ml/m^2 ---------- LV e&', lateral                         6.74  cm/s   ---------- LV E/e&', lateral                       8.06         ---------- LV e&', medial                          5.11  cm/s   ---------- LV E/e&', medial                        10.63        ---------- LV e&', average                         5.93  cm/s   ---------- LV E/e&', average  9.16         ----------  Ventricular septum                     Value        Reference IVS thickness, ED                      9.61  mm     ----------  LVOT                                   Value        Reference LVOT ID, S                             18    mm     ---------- LVOT area                              2.54  cm^2   ---------- LVOT peak velocity, S                  82.8  cm/s   ---------- LVOT mean velocity, S                  52    cm/s   ---------- LVOT VTI, S                            18.2  cm     ----------  Aorta                                  Value        Reference Ascending aorta ID, A-P, S             35    mm     ----------  Left atrium                            Value        Reference LA ID, A-P, ES                         29    mm     ---------- LA ID/bsa, A-P                         1.65  cm/m^2 <=2.2 LA volume, ES, 1-p A4C                 20.2  ml     ---------- LA volume/bsa, ES, 1-p A4C             11.5  ml/m^2 ---------- LA volume, ES, 1-p A2C                 10    ml     ---------- LA volume/bsa, ES, 1-p A2C             5.7   ml/m^2 ----------  Mitral valve  Value        Reference Mitral E-wave peak velocity            54.3  cm/s   ---------- Mitral A-wave peak velocity            77.1  cm/s   ---------- Mitral deceleration time       (H)     352   ms     150 - 230 Mitral E/A ratio, peak                 0.7          ----------  Right atrium                           Value        Reference RA ID, S-I, ES, A4C            (L)     32.9  mm     34 - 49 RA area, ES, A4C                       9.05  cm^2   8.3 - 19.5 RA volume, ES, A/L                     20.8  ml     ---------- RA volume/bsa, ES, A/L                 11.8  ml/m^2 ----------  Right ventricle                        Value        Reference TAPSE                                  20    mm     ---------- RV s&', lateral, S                      11.9  cm/s   ----------  Legend: (L)  and  (H)  mark values outside specified reference range.  ------------------------------------------------------------------- Prepared and Electronically Authenticated by  Sanda Klein, MD 2019-08-27T14:38:54     CT SCANS  CT CARDIAC SCORING (SELF PAY ONLY) 09/12/2022  Addendum 09/12/2022  8:20 PM ADDENDUM REPORT: 09/12/2022 20:17  EXAM: OVER-READ INTERPRETATION  CT CHEST  The following report is an over-read performed by radiologist Dr. Samara Snide [facility], [place] on 09/12/2022. This over-read does not include interpretation of cardiac or coronary anatomy or pathology. The coronary calcium score interpretation by the cardiologist is attached.  COMPARISON:  06/17/2018 chest radiograph.  FINDINGS: Cardiovascular: Normal heart size. No significant pericardial effusion/thickening. Great vessels are normal in course and caliber.  Mediastinum/Nodes: Unremarkable esophagus. No pathologically enlarged mediastinal or hilar lymph nodes, noting limited sensitivity for the detection of hilar adenopathy on this noncontrast study.  Lungs/Pleura: No pneumothorax. No  pleural effusion. No acute consolidative airspace disease or lung masses. Two scattered tiny solid pulmonary nodules measuring 0.2 cm in the peripheral right middle lobe (series 5/image 25) and 0.4 cm in the peripheral left lower lobe (series 5/image 25).  Upper abdomen: No acute abnormality.  Musculoskeletal: No aggressive appearing focal osseous lesions. Mild thoracic spondylosis.  IMPRESSION: Two scattered tiny solid pulmonary nodules, largest 0.4 cm. No follow-up needed  if patient is low-risk (and has no known or suspected primary neoplasm). Non-contrast chest CT can be considered in 12 months if patient is high-risk. This recommendation follows the consensus statement: Guidelines for Management of Incidental Pulmonary Nodules Detected on CT Images: From the Fleischner Society 2017; Radiology 2017; 284:228-243.   Electronically Signed By: Ilona Sorrel M.D. On: 09/12/2022 20:17  Narrative CLINICAL DATA:  Cardiovascular Disease Risk stratification  EXAM: Coronary Calcium Score  MEDICATIONS: MEDICATIONS None  TECHNIQUE: A gated, non-contrast computed tomography scan of the heart was performed using 23mm slice thickness. Axial images were analyzed on a dedicated workstation. Calcium scoring of the coronary arteries was performed using the Agatston method.  FINDINGS: Coronary arteries: Normal origins.  Coronary Calcium Score:  Left main: 0  Left anterior descending artery: 0  Left circumflex artery: 176  Right coronary artery: 0  Total: 176  Percentile: 76th  Pericardium: Normal.  Ascending Aorta: Normal caliber.  Non-cardiac: See separate report from [facility].  IMPRESSION: Coronary calcium score of 176 Agatston units. This was 76th percentile for age-, race-, and sex-matched controls.  RECOMMENDATIONS: Coronary artery calcium (CAC) score is a strong predictor of incident coronary heart disease (CHD) and provides predictive information  beyond traditional risk factors. CAC scoring is reasonable to use in the decision to withhold, postpone, or initiate statin therapy in intermediate-risk or selected borderline-risk asymptomatic adults (age 59-75 years and LDL-C >=70 to <190 mg/dL) who do not have diabetes or established atherosclerotic cardiovascular disease (ASCVD).* In intermediate-risk (10-year ASCVD risk >=7.5% to <20%) adults or selected borderline-risk (10-year ASCVD risk >=5% to <7.5%) adults in whom a CAC score is measured for the purpose of making a treatment decision the following recommendations have been made:  If CAC=0, it is reasonable to withhold statin therapy and reassess in 5 to 10 years, as long as higher risk conditions are absent (diabetes mellitus, family history of premature CHD in first degree relatives (males <55 years; females <65 years), cigarette smoking, or LDL >=190 mg/dL).  If CAC is 1 to 99, it is reasonable to initiate statin therapy for patients >=57 years of age.  If CAC is >=100 or >=75th percentile, it is reasonable to initiate statin therapy at any age.  Cardiology referral should be considered for patients with CAC scores >=400 or >=75th percentile.  *2018 AHA/ACC/AACVPR/AAPA/ABC/ACPM/ADA/AGS/APhA/ASPC/NLA/PCNA Guideline on the Management of Blood Cholesterol: A Report of the American College of Cardiology/American Heart Association Task Force on Clinical Practice Guidelines. J Am Coll Cardiol. 2019;73(24):3168-3209.  Loralie Champagne, MD  Electronically Signed: By: Loralie Champagne M.D. On: 09/12/2022 15:27          Risk Assessment/Calculations:              Physical Exam:   VS:  BP 126/78   Pulse 73   Ht 5\' 4"  (1.626 m)   Wt 138 lb 9.6 oz (62.9 kg)   SpO2 99%   BMI 23.79 kg/m    Wt Readings from Last 3 Encounters:  01/17/23 138 lb 9.6 oz (62.9 kg)  06/17/18 149 lb (67.6 kg)     GEN: Well nourished, well developed in no acute distress NECK: No JVD; No  carotid bruits CARDIAC: RRR, no murmurs, rubs, gallops RESPIRATORY:  Clear to auscultation without rales, wheezing or rhonchi  ABDOMEN: Soft, non-tender, non-distended EXTREMITIES:  No edema; No deformity   ASSESSMENT AND PLAN:   #Elevated Ca Score: #HLD: Ca score 176 (76%). Currently doing well without anginal symptoms. Will continue with secondary  prevention. -Continue crestor 10mg  daily -Repeat cholesterol for monitoring -Goal LDL<70  #HTN: Well controlled and at goal. -Continue HCTZ 25mg  daily        Signed, [name], MD

## 2023-01-17 ENCOUNTER — Ambulatory Visit: Payer: Medicare PPO | Attending: Interventional Cardiology | Admitting: Cardiology

## 2023-01-17 ENCOUNTER — Encounter: Payer: Self-pay | Admitting: Cardiology

## 2023-01-17 VITALS — BP 126/78 | HR 73 | Ht 64.0 in | Wt 138.6 lb

## 2023-01-17 DIAGNOSIS — I2510 Atherosclerotic heart disease of native coronary artery without angina pectoris: Secondary | ICD-10-CM | POA: Diagnosis not present

## 2023-01-17 DIAGNOSIS — Z79899 Other long term (current) drug therapy: Secondary | ICD-10-CM | POA: Diagnosis not present

## 2023-01-17 DIAGNOSIS — E782 Mixed hyperlipidemia: Secondary | ICD-10-CM | POA: Diagnosis not present

## 2023-01-17 NOTE — Patient Instructions (Signed)
Medication Instructions:   Your physician recommends that you continue on your current medications as directed. Please refer to the Current Medication list given to you today.  *If you need a refill on your cardiac medications before your next appointment, please call your pharmacy*   Lab Work:  [facility] OFFICE--01/20/23--CHECK LIPIDS--PLEASE COME FASTING TO THIS LAB APPOINTMENT  If you have labs (blood work) drawn today and your tests are completely normal, you will receive your results only by: [place] (if you have MyChart) OR A paper copy in the mail If you have any lab test that is abnormal or we need to change your treatment, we will call you to review the results.    Follow-Up: At [facility], you and your health needs are our priority.  As part of our continuing mission to provide you with exceptional heart care, we have created designated Provider Care Teams.  These Care Teams include your primary Cardiologist (physician) and Advanced Practice Providers (APPs -  Physician Assistants and Nurse Practitioners) who all work together to provide you with the care you need, when you need it.  We recommend signing up for the patient portal called "MyChart".  Sign up information is provided on this After Visit Summary.  MyChart is used to connect with patients for Virtual Visits (Telemedicine).  Patients are able to view lab/test results, encounter notes, upcoming appointments, etc.  Non-urgent messages can be sent to your provider as well.   To learn more about what you can do with MyChart, go to [email or web address].    Your next appointment:   1 year(s)  Provider:   DR. Johney Frame

## 2023-01-20 ENCOUNTER — Ambulatory Visit: Payer: Medicare PPO | Attending: Cardiology

## 2023-01-20 DIAGNOSIS — Z79899 Other long term (current) drug therapy: Secondary | ICD-10-CM | POA: Diagnosis not present

## 2023-01-20 DIAGNOSIS — E782 Mixed hyperlipidemia: Secondary | ICD-10-CM | POA: Diagnosis not present

## 2023-01-20 DIAGNOSIS — I2510 Atherosclerotic heart disease of native coronary artery without angina pectoris: Secondary | ICD-10-CM

## 2023-01-21 LAB — LIPID PANEL
Chol/HDL Ratio: 2.3 ratio (ref 0.0–4.4)
Cholesterol, Total: 188 mg/dL (ref 100–199)
HDL: 81 mg/dL (ref >39)
LDL Chol Calc (NIH): 90 mg/dL (ref 0–99)
Triglycerides: 95 mg/dL (ref 0–149)
VLDL Cholesterol Cal: 17 mg/dL (ref 5–40)

## 2023-01-23 ENCOUNTER — Telehealth: Payer: Self-pay

## 2023-01-23 DIAGNOSIS — E782 Mixed hyperlipidemia: Secondary | ICD-10-CM

## 2023-01-23 NOTE — Telephone Encounter (Signed)
-----   Message from [name], MD sent at 01/22/2023  7:22 AM EDT ----- Her LDL cholesterol is above goal given elevated Ca score. Can we increase her crestor to 20mg  daily and repeat cholesterol in 8 weeks to ensure she is at goal.

## 2023-01-23 NOTE — Telephone Encounter (Signed)
Patient is aware of lab result and provider recommendations to increase crestor to 20mg  daily and repeat lipid profile. Appointment scheduled.

## 2023-01-31 DIAGNOSIS — L821 Other seborrheic keratosis: Secondary | ICD-10-CM | POA: Diagnosis not present

## 2023-01-31 DIAGNOSIS — D3612 Benign neoplasm of peripheral nerves and autonomic nervous system, upper limb, including shoulder: Secondary | ICD-10-CM | POA: Diagnosis not present

## 2023-01-31 DIAGNOSIS — L820 Inflamed seborrheic keratosis: Secondary | ICD-10-CM | POA: Diagnosis not present

## 2023-01-31 DIAGNOSIS — L570 Actinic keratosis: Secondary | ICD-10-CM | POA: Diagnosis not present

## 2023-01-31 DIAGNOSIS — D485 Neoplasm of uncertain behavior of skin: Secondary | ICD-10-CM | POA: Diagnosis not present

## 2023-01-31 DIAGNOSIS — D1801 Hemangioma of skin and subcutaneous tissue: Secondary | ICD-10-CM | POA: Diagnosis not present

## 2023-03-13 DIAGNOSIS — H40013 Open angle with borderline findings, low risk, bilateral: Secondary | ICD-10-CM | POA: Diagnosis not present

## 2023-03-13 DIAGNOSIS — H524 Presbyopia: Secondary | ICD-10-CM | POA: Diagnosis not present

## 2023-03-13 DIAGNOSIS — H35011 Changes in retinal vascular appearance, right eye: Secondary | ICD-10-CM | POA: Diagnosis not present

## 2023-03-13 DIAGNOSIS — H25813 Combined forms of age-related cataract, bilateral: Secondary | ICD-10-CM | POA: Diagnosis not present

## 2023-03-13 DIAGNOSIS — H04123 Dry eye syndrome of bilateral lacrimal glands: Secondary | ICD-10-CM | POA: Diagnosis not present

## 2023-03-28 ENCOUNTER — Ambulatory Visit: Payer: Medicare PPO | Attending: Cardiology

## 2023-03-28 DIAGNOSIS — E782 Mixed hyperlipidemia: Secondary | ICD-10-CM | POA: Diagnosis not present

## 2023-03-29 ENCOUNTER — Telehealth: Payer: Self-pay | Admitting: *Deleted

## 2023-03-29 DIAGNOSIS — Z79899 Other long term (current) drug therapy: Secondary | ICD-10-CM

## 2023-03-29 DIAGNOSIS — E782 Mixed hyperlipidemia: Secondary | ICD-10-CM

## 2023-03-29 DIAGNOSIS — I2510 Atherosclerotic heart disease of native coronary artery without angina pectoris: Secondary | ICD-10-CM

## 2023-03-29 LAB — LIPID PANEL
Chol/HDL Ratio: 2 ratio (ref 0.0–4.4)
Cholesterol, Total: 180 mg/dL (ref 100–199)
HDL: 88 mg/dL (ref >39)
LDL Chol Calc (NIH): 74 mg/dL (ref 0–99)
Triglycerides: 102 mg/dL (ref 0–149)
VLDL Cholesterol Cal: 18 mg/dL (ref 5–40)

## 2023-03-29 NOTE — Telephone Encounter (Signed)
The patient has been notified of the result and verbalized understanding.  All questions (if any) were answered.  Pt states she would like to continue her current regimen and really work on healthy lifestyle modification/changes and come in for repeat lipids in 8 weeks to reassess.   Scheduled the pt repeat lipids in 8 weeks for 05/25/23.  She is aware to come fasting to this lab appt.   Pt verbalized understanding and agrees with this plan.

## 2023-03-29 NOTE — Telephone Encounter (Signed)
-----   Message from Meriam Sprague, MD sent at 03/29/2023  6:57 AM EDT ----- Cholesterol is significantly improved. LDL is just slightly above goal at 74. Can continue with current regiment and lifestyle changes and repeat lipids in 8 weeks OR can add zetia 10mg  now to get her to goal and repeat lipids in 3months

## 2023-05-25 ENCOUNTER — Other Ambulatory Visit: Payer: Medicare PPO

## 2023-05-26 ENCOUNTER — Ambulatory Visit: Payer: Medicare PPO | Attending: Cardiology

## 2023-05-26 DIAGNOSIS — E782 Mixed hyperlipidemia: Secondary | ICD-10-CM | POA: Diagnosis not present

## 2023-05-26 DIAGNOSIS — I2510 Atherosclerotic heart disease of native coronary artery without angina pectoris: Secondary | ICD-10-CM

## 2023-05-26 DIAGNOSIS — Z79899 Other long term (current) drug therapy: Secondary | ICD-10-CM

## 2023-09-05 DIAGNOSIS — Z1231 Encounter for screening mammogram for malignant neoplasm of breast: Secondary | ICD-10-CM | POA: Diagnosis not present

## 2023-10-12 DIAGNOSIS — U071 COVID-19: Secondary | ICD-10-CM | POA: Diagnosis not present

## 2023-11-16 ENCOUNTER — Ambulatory Visit: Payer: Medicare PPO | Admitting: Orthopedic Surgery

## 2023-11-16 ENCOUNTER — Other Ambulatory Visit: Payer: Self-pay | Admitting: Orthopedic Surgery

## 2023-11-16 ENCOUNTER — Encounter: Payer: Self-pay | Admitting: Orthopedic Surgery

## 2023-11-16 VITALS — BP 154/88 | HR 77 | Temp 95.2°F | Resp 16 | Ht 64.0 in | Wt 142.6 lb

## 2023-11-16 DIAGNOSIS — I10 Essential (primary) hypertension: Secondary | ICD-10-CM

## 2023-11-16 DIAGNOSIS — H903 Sensorineural hearing loss, bilateral: Secondary | ICD-10-CM

## 2023-11-16 DIAGNOSIS — E7800 Pure hypercholesterolemia, unspecified: Secondary | ICD-10-CM | POA: Insufficient documentation

## 2023-11-16 DIAGNOSIS — E039 Hypothyroidism, unspecified: Secondary | ICD-10-CM | POA: Diagnosis not present

## 2023-11-16 DIAGNOSIS — M810 Age-related osteoporosis without current pathological fracture: Secondary | ICD-10-CM | POA: Diagnosis not present

## 2023-11-16 MED ORDER — LOSARTAN POTASSIUM 25 MG PO TABS: 25.0000 mg | ORAL_TABLET | Freq: Every day | ORAL | 1 refills | Status: DC

## 2023-11-16 NOTE — Patient Instructions (Addendum)
Schedule a fasting lab visit within next week  Contact [facility] to have records forwarded to us> immunization records/ mammogram/DEXA/ recent labs> FAX [phone]  Bring advanced directives so we can make a copy next visit  Stop hydrochlorothiazide and K+  Start losartan 25 mg daily  Check blood pressure twice daily x 7 days

## 2023-11-16 NOTE — Telephone Encounter (Signed)
Pharmacy is trying to prompt [place] to approve #90 pills at a time. This was a new patient today, please advise.

## 2023-11-16 NOTE — Progress Notes (Signed)
Careteam: Patient Care Team: Farris Has, MD as PCP - General (Family Medicine)  Seen by: Hazle Nordmann, AGNP-C  PLACE OF SERVICE:  [facility]  Advanced Directive information    Allergies  Allergen Reactions   Iodinated Contrast Media Itching    Chief Complaint  Patient presents with   Establish Care    New patient.      HPI: Patient is a 73 y.o. female seen today to establish at [facility].   Previous provider was Dr. Farris Has. She wanted senior care specialist. Originally from [facility]. Divorced. 1 son. Masters degree. 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    HTN- elevated blood pressure began about 10 years ago, remains on hydrochlorothiazide and potassium> taking as prescribed> blood pressure elevated today   HLD- family history of hyperlipidemia, total 181, LDL 78 05/2023, stopped taking statin a month ago due to concerns for kidney disease, past use of niacin per chart review  Hypothyroidism- remains on levothyroxine  Osteoporosis- last bone density about 3 years ago, remains on calcium and vitamin D, past use of Fosamax  Hearing loss- scheduling with audiology  Concerns for kidney disease. Reports being told this over the phone a few weeks ago. She stopped statin after researching medication can cause kidney disease.   Specialists:  Dr. Tawanna Cooler Meisinger> gynecology  Dr. Rosemary Holms cardiology  [facility]> ophthalmologist  Dr. Theador Hawthorne dermatology  No recent hospitalizations or accidents.   Past surgeries:  Tear duct surgery 2015  Right thumb cyst removal 2017  Past hospitalizations> 1987 due to childbirth  Past procedures:  Colonoscopy> 2 performed> cannot recall when last one was> done with [facility]  Mammogram with [facility] 08/2023> normal  Family history:  Father deceased at age 87> MI, HTN, HLD, T2DM  Mother aged 92> T2DM, HTN, HLD, CKD  No  siblings  Maternal grandmother had MI at age 47  Maternal aunt had cancer at age 44  Maternal grandfather had dementia at age 49  Smoking: smoked 5-10 cigarettes x 15 years, quit smoking 2016 Alcohol: drinks 6-8 glasses wine per week No illicit drug use  Diet: does not drink soda, tries to limit sugar, eating 2 meals daily Exercise: yoga and walking 5x/week  She has DNR and advanced directives.   Review of Systems:  Review of Systems  Constitutional: Negative.   HENT: Negative.    Eyes: Negative.   Respiratory:  Negative for shortness of breath.   Cardiovascular:  Negative for chest pain.  Gastrointestinal: Negative.   Genitourinary: Negative.   Musculoskeletal: Negative.   Skin: Negative.   Neurological: Negative.   Psychiatric/Behavioral: Negative.      Past Medical History:  Diagnosis Date   Elevated glucose    High cholesterol    Hyperlipidemia    Hypertension    Hypothyroidism    Lung nodules    Osteoporosis    Tingling in extremities    No past surgical history on file. Social History:   reports that she has quit smoking. She does not have any smokeless tobacco history on file. She reports current alcohol use of about 7.0 standard drinks of alcohol per week. She reports that she does not use drugs.  Family History  Problem Relation Age of Onset   Hypertension Mother    Diabetes Mother    Heart failure Father    CAD Father    Diabetes Father    Glaucoma  Father     Medications: Patient's Medications  New Prescriptions   No medications on file  Previous Medications   CALCIUM CARB-CHOLECALCIFEROL (CALCIUM 600 + D PO)    Take 1 tablet by mouth daily at 12 noon.   HYDROCHLOROTHIAZIDE (HYDRODIURIL) 25 MG TABLET    Take 25 mg by mouth daily.   LEVOTHYROXINE (SYNTHROID) 25 MCG TABLET    Take 25 mcg by mouth daily before breakfast.   MULTIPLE VITAMINS-MINERALS (CENTRUM SILVER PO)    Take 1 tablet by mouth at bedtime.   POTASSIUM CHLORIDE (K-DUR) 10 MEQ  TABLET    Take 10 mEq by mouth 2 (two) times daily.   ROSUVASTATIN (CRESTOR) 20 MG TABLET    Take 20 mg by mouth daily.  Modified Medications   No medications on file  Discontinued Medications   MULTIPLE VITAMIN (MULTIVITAMIN) CAPSULE    Take 1 capsule by mouth daily.   ROSUVASTATIN (CRESTOR) 10 MG TABLET    Take 10 mg by mouth daily.    Physical Exam:  Vitals:   11/16/23 1030  BP: (!) 150/90  Pulse: 77  Resp: 16  Temp: (!) 95.2 F (35.1 C)  SpO2: 98%  Weight: 142 lb 9.6 oz (64.7 kg)  Height: 5\' 4"  (1.626 m)   Body mass index is 24.48 kg/m. Wt Readings from Last 3 Encounters:  11/16/23 142 lb 9.6 oz (64.7 kg)  01/17/23 138 lb 9.6 oz (62.9 kg)  06/17/18 149 lb (67.6 kg)    Physical Exam Vitals reviewed.  Constitutional:      General: She is not in acute distress. HENT:     Head: Normocephalic.  Eyes:     General:        Right eye: No discharge.        Left eye: No discharge.     Comments: Corrective lenses  Cardiovascular:     Rate and Rhythm: Normal rate and regular rhythm.     Pulses: Normal pulses.     Heart sounds: Normal heart sounds.  Pulmonary:     Effort: Pulmonary effort is normal. No respiratory distress.     Breath sounds: Normal breath sounds. No wheezing.  Abdominal:     Palpations: Abdomen is soft.  Musculoskeletal:     Cervical back: Neck supple.  Skin:    General: Skin is warm.     Capillary Refill: Capillary refill takes less than 2 seconds.  Neurological:     General: No focal deficit present.     Mental Status: She is alert and oriented to person, place, and time.  Psychiatric:        Mood and Affect: Mood normal.     Labs reviewed: Basic Metabolic Panel: No results for input(s): "NA", "K", "CL", "CO2", "GLUCOSE", "BUN", "CREATININE", "CALCIUM", "MG", "PHOS", "TSH" in the last 8760 hours. Liver Function Tests: No results for input(s): "AST", "ALT", "ALKPHOS", "BILITOT", "PROT", "ALBUMIN" in the last 8760 hours. No results for  input(s): "LIPASE", "AMYLASE" in the last 8760 hours. No results for input(s): "AMMONIA" in the last 8760 hours. CBC: No results for input(s): "WBC", "NEUTROABS", "HGB", "HCT", "MCV", "PLT" in the last 8760 hours. Lipid Panel: Recent Labs    01/20/23 0820 03/28/23 0700 05/26/23 0819  CHOL 188 180 181  HDL 81 88 91  LDLCALC 90 74 78  TRIG 95 102 64  CHOLHDL 2.3 2.0 2.0   TSH: No results for input(s): "TSH" in the last 8760 hours. A1C: No results found for: "HGBA1C"   Assessment/Plan  1. Essential hypertension (Primary) - uncontrolled, goal < 150/90 - discontinue hydrochlorothiazide - start losartan 25 mg daily  - take blood pressures BID x 7 days after starting new medication - limit sodium in diet - losartan (COZAAR) 25 MG tablet; Take 1 tablet (25 mg total) by mouth daily.  Dispense: 30 tablet; Refill: 1  2. Pure hypercholesterolemia - total 181, LDL 78 05/2023 - stopped statin > 4 weeks ago - family history of HLD - lipid panel  3. Hypothyroidism, unspecified type - cont levothyroxine  4. Age-related osteoporosis without current pathological fracture - DEXA requested from [facility] - cont calcium and vitamin D  5. Sensorineural hearing loss (SNHL) of both ears - scheduled to see audiology   Total time: 52 minutes. Greater than 50% of total time spent doing patient education regarding health maintenance, HTN, HLD, hypothyroidism and osteoporosis including symptom/medication management.    Next appt: 11/16/2023  Hazle Nordmann, Juel Burrow  [facility] & Adult Medicine [phone]

## 2023-11-17 ENCOUNTER — Other Ambulatory Visit: Payer: Medicare PPO

## 2023-11-17 DIAGNOSIS — E7800 Pure hypercholesterolemia, unspecified: Secondary | ICD-10-CM

## 2023-11-17 DIAGNOSIS — E039 Hypothyroidism, unspecified: Secondary | ICD-10-CM

## 2023-11-17 DIAGNOSIS — I10 Essential (primary) hypertension: Secondary | ICD-10-CM

## 2023-11-18 LAB — COMPLETE METABOLIC PANEL WITH GFR
AG Ratio: 1.7 (calc) (ref 1.0–2.5)
ALT: 13 U/L (ref 6–29)
AST: 18 U/L (ref 10–35)
Albumin: 4.4 g/dL (ref 3.6–5.1)
Alkaline phosphatase (APISO): 70 U/L (ref 37–153)
BUN: 23 mg/dL (ref 7–25)
CO2: 29 mmol/L (ref 20–32)
Calcium: 9.6 mg/dL (ref 8.6–10.4)
Chloride: 100 mmol/L (ref 98–110)
Creat: 0.86 mg/dL (ref 0.60–1.00)
Globulin: 2.6 g/dL (ref 1.9–3.7)
Glucose, Bld: 98 mg/dL (ref 65–99)
Potassium: 3.9 mmol/L (ref 3.5–5.3)
Sodium: 138 mmol/L (ref 135–146)
Total Bilirubin: 0.5 mg/dL (ref 0.2–1.2)
Total Protein: 7 g/dL (ref 6.1–8.1)
eGFR: 71 mL/min/{1.73_m2} (ref >=60)

## 2023-11-18 LAB — CBC WITH DIFFERENTIAL/PLATELET
Absolute Lymphocytes: 2331 {cells}/uL (ref 850–3900)
Absolute Monocytes: 620 {cells}/uL (ref 200–950)
Basophils Absolute: 30 {cells}/uL (ref 0–200)
Basophils Relative: 0.5 %
Eosinophils Absolute: 59 {cells}/uL (ref 15–500)
Eosinophils Relative: 1 %
HCT: 43.7 % (ref 35.0–45.0)
Hemoglobin: 14.7 g/dL (ref 11.7–15.5)
MCH: 31.1 pg (ref 27.0–33.0)
MCHC: 33.6 g/dL (ref 32.0–36.0)
MCV: 92.4 fL (ref 80.0–100.0)
MPV: 9.7 fL (ref 7.5–12.5)
Monocytes Relative: 10.5 %
Neutro Abs: 2862 {cells}/uL (ref 1500–7800)
Neutrophils Relative %: 48.5 %
Platelets: 260 10*3/uL (ref 140–400)
RBC: 4.73 10*6/uL (ref 3.80–5.10)
RDW: 12.8 % (ref 11.0–15.0)
Total Lymphocyte: 39.5 %
WBC: 5.9 10*3/uL (ref 3.8–10.8)

## 2023-11-18 LAB — LIPID PANEL
Cholesterol: 248 mg/dL — ABNORMAL HIGH (ref <200)
HDL: 85 mg/dL (ref >=50)
LDL Cholesterol (Calc): 147 mg/dL — ABNORMAL HIGH
Non-HDL Cholesterol (Calc): 163 mg/dL — ABNORMAL HIGH (ref <130)
Total CHOL/HDL Ratio: 2.9 (calc) (ref <5.0)
Triglycerides: 66 mg/dL (ref <150)

## 2023-11-18 LAB — TSH: TSH: 4.93 m[IU]/L — ABNORMAL HIGH (ref 0.40–4.50)

## 2023-11-20 ENCOUNTER — Encounter: Payer: Self-pay | Admitting: Orthopedic Surgery

## 2023-11-27 ENCOUNTER — Other Ambulatory Visit: Payer: Self-pay | Admitting: Orthopedic Surgery

## 2023-11-27 DIAGNOSIS — I10 Essential (primary) hypertension: Secondary | ICD-10-CM

## 2023-12-14 ENCOUNTER — Ambulatory Visit: Payer: Medicare PPO | Admitting: Orthopedic Surgery

## 2023-12-21 ENCOUNTER — Encounter: Payer: Medicare PPO | Admitting: Orthopedic Surgery

## 2023-12-22 NOTE — Progress Notes (Signed)
 This encounter was created in error - please disregard.

## 2023-12-25 ENCOUNTER — Other Ambulatory Visit: Payer: Self-pay | Admitting: Orthopedic Surgery

## 2023-12-26 NOTE — Telephone Encounter (Signed)
 Patient has request refill on medication that hasn't been refilled by PCP Octavia Heir, NP . Medication pend and sent for approval.

## 2024-01-03 ENCOUNTER — Telehealth (INDEPENDENT_AMBULATORY_CARE_PROVIDER_SITE_OTHER): Payer: Self-pay | Admitting: Otolaryngology

## 2024-01-03 NOTE — Telephone Encounter (Signed)
 Left vm to confirm appt and address for 01/04/2024.

## 2024-01-04 ENCOUNTER — Encounter (INDEPENDENT_AMBULATORY_CARE_PROVIDER_SITE_OTHER): Payer: Self-pay

## 2024-01-04 ENCOUNTER — Ambulatory Visit (INDEPENDENT_AMBULATORY_CARE_PROVIDER_SITE_OTHER): Payer: Medicare PPO | Admitting: Otolaryngology

## 2024-01-04 ENCOUNTER — Ambulatory Visit (INDEPENDENT_AMBULATORY_CARE_PROVIDER_SITE_OTHER): Payer: Medicare PPO | Admitting: Audiology

## 2024-01-04 VITALS — BP 136/81 | HR 71 | Ht 65.0 in | Wt 140.0 lb

## 2024-01-04 DIAGNOSIS — H90A31 Mixed conductive and sensorineural hearing loss, unilateral, right ear with restricted hearing on the contralateral side: Secondary | ICD-10-CM

## 2024-01-04 DIAGNOSIS — H9042 Sensorineural hearing loss, unilateral, left ear, with unrestricted hearing on the contralateral side: Secondary | ICD-10-CM | POA: Diagnosis not present

## 2024-01-04 DIAGNOSIS — H903 Sensorineural hearing loss, bilateral: Secondary | ICD-10-CM | POA: Diagnosis not present

## 2024-01-04 DIAGNOSIS — H9011 Conductive hearing loss, unilateral, right ear, with unrestricted hearing on the contralateral side: Secondary | ICD-10-CM

## 2024-01-04 NOTE — Progress Notes (Signed)
 Patient ID: Kari Lambert, female   DOB: 09/25/1951, 73 y.o.   MRN: [id number]  CC: Progressive hearing loss  HPI:  Kari Lambert is a 73 y.o. female who presents today complaining of bilateral progressive hearing loss.  Her hearing loss is worse on the right side.  The patient was previously seen in 2021.  At that time, she was noted to have bilateral high-frequency sensorineural hearing loss, worse on the right side at low and mid frequencies.  The small possibility of a retrocochlear lesion was discussed.  The patient elected to proceed with conservative observation.  The patient presents today complaining that her hearing has worsened.  She is having difficulty hearing in noisy environments.  She denies any otalgia, otorrhea, vertigo, or tinnitus.  She has no recent otitis media or otitis externa.  She has no previous otologic surgery.  Past Medical History:  Diagnosis Date   Elevated glucose    High cholesterol    Hyperlipidemia    Hypertension    Hypothyroidism    Lung nodules    Osteoporosis    Tingling in extremities     No past surgical history on file.  Family History  Problem Relation Age of Onset   Hypertension Mother    Diabetes Mother    Heart failure Father    CAD Father    Diabetes Father    Glaucoma Father     Social History:  reports that she has quit smoking. She does not have any smokeless tobacco history on file. She reports current alcohol use of about 4.0 standard drinks of alcohol per week. She reports that she does not use drugs.  Allergies:  Allergies  Allergen Reactions   Iodinated Contrast Media Itching    Prior to Admission medications   Medication Sig Start Date End Date Taking? Authorizing Provider  Calcium Carb-Cholecalciferol (CALCIUM 600 + D PO) Take 1 tablet by mouth daily at 12 noon.    [provider]  levothyroxine (SYNTHROID) 25 MCG tablet Take 25 mcg by mouth daily before breakfast.    [provider]   losartan (COZAAR) 25 MG tablet TAKE 1 TABLET BY MOUTH ONCE DAILY 11/28/23   Fargo, Amy E, NP  Multiple Vitamins-Minerals (CENTRUM SILVER PO) Take 1 tablet by mouth at bedtime.    [provider]  OVER THE COUNTER MEDICATION Take 2 Pieces by mouth daily. Super Beets  [employer], Historical, MD  rosuvastatin (CRESTOR) 20 MG tablet TAKE 1 TABLET BY MOUTH EVERY DAY 12/26/23   Fargo, Amy E, NP    There were no vitals taken for this visit. Exam: General: Communicates without difficulty, well nourished, no acute distress. Head: Normocephalic, no evidence injury, no tenderness, facial buttresses intact without stepoff. Face/sinus: No tenderness to palpation and percussion. Facial movement is normal and symmetric. Eyes: PERRL, EOMI. No scleral icterus, conjunctivae clear. Neuro: CN II exam reveals vision grossly intact.  No nystagmus at any point of gaze. Ears: Auricles well formed without lesions.  Ear canals are intact without mass or lesion.  No erythema or edema is appreciated.  The TMs are intact without fluid. Nose: External evaluation reveals normal support and skin without lesions.  Dorsum is intact.  Anterior rhinoscopy reveals congested mucosa over anterior aspect of inferior turbinates and intact septum.  No purulence noted. Oral:  Oral cavity and oropharynx are intact, symmetric, without erythema or edema.  Mucosa is moist without lesions. Neck: Full range of motion without pain.  There  is no significant lymphadenopathy.  No masses palpable.  Thyroid bed within normal limits to palpation.  Parotid glands and submandibular glands equal bilaterally without mass.  Trachea is midline. Neuro:  CN 2-12 grossly intact.   Her hearing test shows stable bilateral high-frequency sensorineural hearing loss, with moderate conductive hearing loss on the right side.  Assessment: 1.  Stable bilateral high-frequency sensorineural hearing loss, with moderate right ear conductive hearing  loss. 2.  Her ear canals, tympanic membranes, and middle ear spaces are all normal.  No middle ear effusion or infection is noted. 3.  The conductive hearing loss component may be secondary to otosclerosis.  Plan: 1.  The physical exam findings are reviewed with the patient. 2.  The patient is reassured that no middle ear effusion or infection is noted. 3.  The patient is a candidate for hearing amplification.  The hearing aid options are discussed. 4.  The patient will return for reevaluation in 1 year.  Abou Sterkel W Jenasia Dolinar 01/04/2024, 12:51 PM

## 2024-01-04 NOTE — Progress Notes (Signed)
  [address], [address] [place], [place] [address] [phone]  Audiological Evaluation    Name: Kari Lambert     DOB:   April 01, 1951      MRN:   [id number]                                                                                     Service Date: 01/04/2024     Accompanied by: unaccompanied    Patient comes today after Dr. Suszanne Conners, ENT sent a referral for a hearing evaluation due to concerns with hearing loss.   Symptoms Yes Details  Hearing loss  [x]  Previous audiogram was completed at Dr. Avel Sensor clinic. Perceives hearing may have declined.  Tinnitus  [x]  Reports it is very rare.  Ear pain/ infections/pressure  []    Balance problems  []    Noise exposure history  []    Previous ear surgeries  []    Family history of hearing loss  [x]  Mother, reportedly with age  Amplification  []    Other  []      Otoscopy: Right ear: Clear external ear canals and notable landmarks visualized on the tympanic membrane. Left ear:  Clear external ear canals and notable landmarks visualized on the tympanic membrane.  Tympanometry: Right ear: Type A- Normal external ear canal volume with normal middle ear pressure and tympanic membrane compliance Left ear: Type A- Normal external ear canal volume with normal middle ear pressure and tympanic membrane compliance  Acoustic Reflexes cannot be tested- equipment not available.     Pure tone Audiometry: Right ear- Mild to moderate mixed hearing loss from 125 Hz - 8000 Hz. Left ear-  Normal hearing from [phone] Hz, then mild sensorineural hearing loss from 3000 Hz - 8000 Hz.  Speech Audiometry: Right ear- Speech Reception Threshold (SRT) was obtained at 40 dBHL, with contralateral masking. Left ear-Speech Reception Threshold (SRT) was obtained at 15 dBHL.   Word Recognition Score Tested using NU-6 (MLV) Right ear: 92% was obtained at a presentation level of 75 dBHL with contralateral masking which is deemed as  excellent. Left ear: 92%  was obtained at a presentation level of 60 dBHL with contralateral masking which is deemed as  excellent.   The hearing test results were completed under headphones and results are deemed to be of good to fair reliability. Test technique:  conventional    Impression: The right hearing asymmetry continues to be observed and today air-bone gaps seemed to be greater, when compared to previous audiogram form Dr. [facility] clinic.  Recommendations: Follow up with ENT as scheduled for today. Return for a hearing evaluation if concerns with hearing changes arise or per MD recommendation. Consider a communication needs assessment after medical clearance for hearing aids is obtained.   Kari Lambert Kari Lambert, AUD

## 2024-01-08 ENCOUNTER — Other Ambulatory Visit: Payer: Self-pay | Admitting: Orthopedic Surgery

## 2024-01-10 ENCOUNTER — Encounter: Payer: Self-pay | Admitting: Audiology

## 2024-01-11 ENCOUNTER — Encounter: Payer: Self-pay | Admitting: Orthopedic Surgery

## 2024-01-11 ENCOUNTER — Ambulatory Visit: Payer: Medicare PPO | Admitting: Orthopedic Surgery

## 2024-01-11 VITALS — BP 132/78 | HR 78 | Temp 97.6°F | Resp 19 | Ht 65.0 in | Wt 140.0 lb

## 2024-01-11 DIAGNOSIS — E039 Hypothyroidism, unspecified: Secondary | ICD-10-CM

## 2024-01-11 DIAGNOSIS — E782 Mixed hyperlipidemia: Secondary | ICD-10-CM | POA: Diagnosis not present

## 2024-01-11 DIAGNOSIS — I10 Essential (primary) hypertension: Secondary | ICD-10-CM

## 2024-01-11 DIAGNOSIS — R931 Abnormal findings on diagnostic imaging of heart and coronary circulation: Secondary | ICD-10-CM | POA: Insufficient documentation

## 2024-01-11 NOTE — Patient Instructions (Addendum)
 Please take levothyroxine in the middle of the night after urinating so you can enjoy coffee in the morning  Take vitamin C chewable over orange juice- 500 mg to 1000 mg daily  Please ask if cardiology will do TSH, I have placed orders already  Please let me know date of last colonoscopy> or ask [facility] when you are due for next one  Look up trigger finger hand exercises

## 2024-01-11 NOTE — Progress Notes (Unsigned)
 Careteam: Patient Care Team: Octavia Heir, NP as PCP - General (Adult Health Nurse Practitioner)  Seen by: Hazle Nordmann, AGNP-C  PLACE OF SERVICE:  [facility]  Advanced Directive information Does Patient Have a Medical Advance Directive?: Yes, Type of Advance Directive: Healthcare Power of [place];Out of facility DNR (pink MOST or yellow form);Living will, Does patient want to make changes to medical advance directive?: No - Patient declined  Allergies  Allergen Reactions   Iodinated Contrast Media Itching    Chief Complaint  Patient presents with   Follow-up    4 week follow up and discuss Medicare Annual Wellness Visit,mammogram,colonoscopy,dexa scan,Hepatitis C Screening,pneumonia,tdap,and covid vaccines.     HPI: Patient is a 73 y.o. female seen today for medical management of chronic conditions.   Discussed the use of AI scribe software for clinical note transcription with the patient, who gave verbal consent to proceed.  She has been monitoring her blood pressure at home, noting variability with some readings as high as 200 mmHg, which she attributes to her blood pressure device. She is currently on 25 mg of losartan daily, which she believes is maintaining her blood pressure within a therapeutic range. She has recently lost weight and is attempting to lose more, believing it may help her condition. LDL was 147 11/2023. She has since restarted rosuvastatin. She is planning on having lipid panel recheck at cardiology tomorrow.    She has a history of thyroid issues and is currently taking 137 mcg of levothyroxine daily. She previously did not wait the recommended 30-45 minutes before consuming coffee after taking her medication but has since adjusted her routine. She would like TSH rechecked tomorrow with lipid panel.   She is concerned about her blood sugar levels, noting a previous reading of 91 mg/dL five years ago and a recent reading of 98 mg/dL. She is trying to avoid  additional medications and is focusing on maintaining a healthy weight and reducing sugar intake. She consumes berries regularly and has switched from orange juice to vitamin C supplements to manage her sugar intake.  She has a history of skin issues, including precancerous lesions on her hands and back, and plans to see a dermatologist. She notes the presence of skin tags and crusty areas on her back.     Review of Systems:  Review of Systems  Constitutional: Negative.   HENT: Negative.    Eyes: Negative.   Respiratory: Negative.    Cardiovascular: Negative.   Gastrointestinal: Negative.   Genitourinary: Negative.   Musculoskeletal:  Positive for joint pain.  Skin: Negative.   Neurological: Negative.   Psychiatric/Behavioral: Negative.      Past Medical History:  Diagnosis Date   Elevated glucose    High cholesterol    Hyperlipidemia    Hypertension    Hypothyroidism    Lung nodules    Osteoporosis    Tingling in extremities    History reviewed. No pertinent surgical history. Social History:   reports that she has quit smoking. She does not have any smokeless tobacco history on file. She reports current alcohol use of about 4.0 standard drinks of alcohol per week. She reports that she does not use drugs.  Family History  Problem Relation Age of Onset   Hypertension Mother    Diabetes Mother    Heart failure Father    CAD Father    Diabetes Father    Glaucoma Father     Medications: Patient's Medications  New Prescriptions  No medications on file  Previous Medications   CALCIUM CARB-CHOLECALCIFEROL (CALCIUM 600 + D PO)    Take 1 tablet by mouth daily at 12 noon.   LEVOTHYROXINE (SYNTHROID) 25 MCG TABLET    Take 25 mcg by mouth daily before breakfast.   LOSARTAN (COZAAR) 25 MG TABLET    TAKE 1 TABLET BY MOUTH ONCE DAILY   MULTIPLE VITAMINS-MINERALS (CENTRUM SILVER PO)    Take 1 tablet by mouth at bedtime.   OVER THE COUNTER MEDICATION    Take 2 Pieces by mouth  daily. Super Beets  [employer] Flavor   ROSUVASTATIN (CRESTOR) 20 MG TABLET    TAKE 1 TABLET BY MOUTH EVERY DAY  Modified Medications   No medications on file  Discontinued Medications   No medications on file    Physical Exam:  Vitals:   01/11/24 1515  BP: 132/78  Pulse: 78  Resp: 19  Temp: 97.6 F (36.4 C)  SpO2: 98%  Weight: 140 lb (63.5 kg)  Height: 5\' 5"  (1.651 m)   Body mass index is 23.3 kg/m. Wt Readings from Last 3 Encounters:  01/11/24 140 lb (63.5 kg)  01/04/24 140 lb (63.5 kg)  11/16/23 142 lb 9.6 oz (64.7 kg)    Physical Exam Vitals reviewed.  Constitutional:      General: She is not in acute distress. HENT:     Head: Normocephalic.     Right Ear: There is no impacted cerumen.     Left Ear: There is no impacted cerumen.     Nose: Nose normal.     Mouth/Throat:     Mouth: Mucous membranes are moist.  Eyes:     General:        Right eye: No discharge.        Left eye: No discharge.  Cardiovascular:     Rate and Rhythm: Normal rate and regular rhythm.     Pulses: Normal pulses.     Heart sounds: Normal heart sounds.  Pulmonary:     Effort: Pulmonary effort is normal.     Breath sounds: Normal breath sounds.  Abdominal:     General: Bowel sounds are normal.     Palpations: Abdomen is soft.  Musculoskeletal:     Cervical back: Neck supple.     Right lower leg: No edema.     Left lower leg: No edema.  Skin:    General: Skin is warm.     Capillary Refill: Capillary refill takes less than 2 seconds.  Neurological:     General: No focal deficit present.     Mental Status: She is alert and oriented to person, place, and time.  Psychiatric:        Mood and Affect: Mood normal.     Labs reviewed: Basic Metabolic Panel: Recent Labs    11/17/23 0838  NA 138  K 3.9  CL 100  CO2 29  GLUCOSE 98  BUN 23  CREATININE 0.86  CALCIUM 9.6  TSH 4.93*   Liver Function Tests: Recent Labs    11/17/23 0838  AST 18  ALT 13  BILITOT 0.5  PROT  7.0   No results for input(s): "LIPASE", "AMYLASE" in the last 8760 hours. No results for input(s): "AMMONIA" in the last 8760 hours. CBC: Recent Labs    11/17/23 0838  WBC 5.9  NEUTROABS 2,862  HGB 14.7  HCT 43.7  MCV 92.4  PLT 260   Lipid Panel: Recent Labs    03/28/23 0700  05/26/23 0819 11/17/23 0838  CHOL 180 181 248*  HDL 88 91 85  LDLCALC 74 78 147*  TRIG 102 64 66  CHOLHDL 2.0 2.0 2.9   TSH: Recent Labs    11/17/23 0838  TSH 4.93*   A1C: No results found for: "HGBA1C"   Assessment/Plan 1. Essential hypertension (Primary) - controlled, goal < 150/90 - followed by cardiology - BUN/creat 23/0.86 11/17/2023 - cont losartan  2. Mixed hyperlipidemia - total 248/ LDL 147 11/17/2023 - she was not taking statin  - restarted statin last month  3. Acquired hypothyroidism - TSH 4.93 11/17/2023 - not taking levothyroxine on empty stomach - cont levothyroxine - recheck TSH at cardiology 03/14 - TSH; Future  Total time: 31 minutes. Greater than 50% of total time spent doing patient education regarding HTN, HLD, hypothyroidism and recent lab work including symptom/medication management.    Next appt: Visit date not found  Kaisei Gilbo Scherry Ran  [facility] & Adult Medicine [phone]

## 2024-01-11 NOTE — Progress Notes (Unsigned)
  Cardiology Office Note:  .   Date:  01/11/2024  ID:  Kari Lambert, DOB Oct 19, 1951, MRN [id number] PCP: Octavia Heir, NP  [facility] Providers Cardiologist:  Truett Mainland, MD PCP: Octavia Heir, NP  No chief complaint on file.     History of Present Illness: .    Kari Lambert is a 73 y.o. female with hypertension, hyperlipidemia, elevated CAC, family h/o CAD  ***  There were no vitals filed for this visit.   ROS: *** ROS   Studies Reviewed: .        *** Independently interpreted 11/2023: Chol 248, TG 66, HDL 85, LDL 147 HbA1C ***% Hb *** Cr 0.86 ***  05/2023: Chol 181, TG 64, HDL 915, LDL 78  CT cardiac scoring 08/2022: 176, all in LAD, 76th percentile  Risk Assessment/Calculations:   {Does this patient have ATRIAL FIBRILLATION?:[phone]}     Physical Exam:   Physical Exam   VISIT DIAGNOSES: No diagnosis found.   ASSESSMENT AND PLAN: .    Kari Lambert is a 73 y.o. female with hypertension, hyperlipidemia, elevated CAC, family h/o CAD  ***  {Are you ordering a CV Procedure (e.g. stress test, cath, DCCV, TEE, etc)?   Press F2        :[id number]}    No orders of the defined types were placed in this encounter.    F/u in ***  Signed, Elder Negus, MD

## 2024-01-12 ENCOUNTER — Ambulatory Visit: Payer: Medicare PPO | Attending: Cardiology | Admitting: Cardiology

## 2024-01-12 ENCOUNTER — Encounter: Payer: Self-pay | Admitting: Cardiology

## 2024-01-12 VITALS — BP 120/70 | HR 71 | Resp 18 | Ht 65.0 in | Wt 139.0 lb

## 2024-01-12 DIAGNOSIS — R931 Abnormal findings on diagnostic imaging of heart and coronary circulation: Secondary | ICD-10-CM | POA: Diagnosis not present

## 2024-01-12 DIAGNOSIS — E039 Hypothyroidism, unspecified: Secondary | ICD-10-CM

## 2024-01-12 DIAGNOSIS — I10 Essential (primary) hypertension: Secondary | ICD-10-CM | POA: Diagnosis not present

## 2024-01-12 DIAGNOSIS — E782 Mixed hyperlipidemia: Secondary | ICD-10-CM

## 2024-01-12 NOTE — Patient Instructions (Signed)
 Lab Work: LIPID PANEL  TSH  If you have labs (blood work) drawn today and your tests are completely normal, you will receive your results only by: MyChart Message (if you have MyChart) OR A paper copy in the mail If you have any lab test that is abnormal or we need to change your treatment, we will call you to review the results.   Follow-Up: At [facility], you and your health needs are our priority.  As part of our continuing mission to provide you with exceptional heart care, we have created designated Provider Care Teams.  These Care Teams include your primary Cardiologist (physician) and Advanced Practice Providers (APPs -  Physician Assistants and Nurse Practitioners) who all work together to provide you with the care you need, when you need it.    Your next appointment:   1 year(s)  Provider:   Elder Negus, MD     Other Instructions   1st Floor: - Lobby - Registration  - Pharmacy  - Lab - Cafe  2nd Floor: - PV Lab - Diagnostic Testing (echo, CT, nuclear med)  3rd Floor: - Vacant  4th Floor: - TCTS (cardiothoracic surgery) - AFib Clinic - Structural Heart Clinic - Vascular Surgery  - Vascular Ultrasound  5th Floor: - [facility] (general and EP) - Clinical Pharmacy for coumadin, hypertension, lipid, weight-loss medications, and med management appointments    Valet parking services will be available as well.

## 2024-01-13 LAB — LIPID PANEL
Chol/HDL Ratio: 2 ratio (ref 0.0–4.4)
Cholesterol, Total: 161 mg/dL (ref 100–199)
HDL: 81 mg/dL (ref >39)
LDL Chol Calc (NIH): 66 mg/dL (ref 0–99)
Triglycerides: 72 mg/dL (ref 0–149)
VLDL Cholesterol Cal: 14 mg/dL (ref 5–40)

## 2024-01-13 LAB — TSH: TSH: 3.13 u[IU]/mL (ref 0.450–4.500)

## 2024-04-08 ENCOUNTER — Other Ambulatory Visit: Payer: Self-pay | Admitting: Orthopedic Surgery

## 2024-04-08 DIAGNOSIS — I10 Essential (primary) hypertension: Secondary | ICD-10-CM

## 2024-05-15 DIAGNOSIS — H25813 Combined forms of age-related cataract, bilateral: Secondary | ICD-10-CM | POA: Diagnosis not present

## 2024-05-15 DIAGNOSIS — H40013 Open angle with borderline findings, low risk, bilateral: Secondary | ICD-10-CM | POA: Diagnosis not present

## 2024-05-15 DIAGNOSIS — H524 Presbyopia: Secondary | ICD-10-CM | POA: Diagnosis not present

## 2024-06-03 DIAGNOSIS — D692 Other nonthrombocytopenic purpura: Secondary | ICD-10-CM | POA: Diagnosis not present

## 2024-06-03 DIAGNOSIS — L400 Psoriasis vulgaris: Secondary | ICD-10-CM | POA: Diagnosis not present

## 2024-06-03 DIAGNOSIS — L820 Inflamed seborrheic keratosis: Secondary | ICD-10-CM | POA: Diagnosis not present

## 2024-06-03 DIAGNOSIS — L570 Actinic keratosis: Secondary | ICD-10-CM | POA: Diagnosis not present

## 2024-06-03 DIAGNOSIS — D485 Neoplasm of uncertain behavior of skin: Secondary | ICD-10-CM | POA: Diagnosis not present

## 2024-07-08 DIAGNOSIS — L570 Actinic keratosis: Secondary | ICD-10-CM | POA: Diagnosis not present

## 2024-07-08 DIAGNOSIS — L820 Inflamed seborrheic keratosis: Secondary | ICD-10-CM | POA: Diagnosis not present

## 2024-07-08 DIAGNOSIS — L300 Nummular dermatitis: Secondary | ICD-10-CM | POA: Diagnosis not present

## 2024-07-18 ENCOUNTER — Ambulatory Visit: Admitting: Orthopedic Surgery

## 2024-07-18 ENCOUNTER — Encounter: Payer: Self-pay | Admitting: Orthopedic Surgery

## 2024-07-18 VITALS — BP 122/94 | HR 62 | Temp 96.7°F | Resp 16 | Ht 65.0 in | Wt 145.0 lb

## 2024-07-18 DIAGNOSIS — Z638 Other specified problems related to primary support group: Secondary | ICD-10-CM

## 2024-07-18 DIAGNOSIS — Z23 Encounter for immunization: Secondary | ICD-10-CM

## 2024-07-18 DIAGNOSIS — I10 Essential (primary) hypertension: Secondary | ICD-10-CM

## 2024-07-18 DIAGNOSIS — M21611 Bunion of right foot: Secondary | ICD-10-CM | POA: Diagnosis not present

## 2024-07-18 DIAGNOSIS — R635 Abnormal weight gain: Secondary | ICD-10-CM

## 2024-07-18 DIAGNOSIS — E782 Mixed hyperlipidemia: Secondary | ICD-10-CM

## 2024-07-18 DIAGNOSIS — E039 Hypothyroidism, unspecified: Secondary | ICD-10-CM | POA: Diagnosis not present

## 2024-07-18 NOTE — Patient Instructions (Addendum)
 Recommend tylenol [phone] mg as needed for arthritic pain (up to 3 doses daily)  Avoid NSAIDS> ibuprofen, alleve, naproxen   Voltaren gel get topical for joint pain  Schedule yearly medicare wellness with out office> we offer virtual visits   Take blood pressure twice weekly x 2 weeks> record readings> send reading to me on mychart

## 2024-07-18 NOTE — Progress Notes (Signed)
 Careteam: Patient Care Team: Gil Greig BRAVO, NP as PCP - General (Adult Health Nurse Practitioner) Elmira Newman PARAS, MD as PCP - Cardiology (Cardiology) Mammography, [facility] (Diagnostic Radiology)  Seen by: Greig Gil, AGNP-C  PLACE OF SERVICE:  [facility]  Advanced Directive information    Allergies  Allergen Reactions   Iodinated Contrast Media Itching    Chief Complaint  Patient presents with   Medical Management of Chronic Issues    6 month follow up. Discuss the need for Colonoscopy, Mammogram, Dexa scan, Hepatitis C Screening, AWV, PNE vaccine, DTAP vaccine, Covid Booster, Influenza vaccine. NCIR Verified.     HPI: Patient is a 73 y.o. female seen today for medical management of chronic conditions.   Discussed the use of AI scribe software for clinical note transcription with the patient, who gave verbal consent to proceed.  History of Present Illness   Kari Lambert is a 73 year old female who presents for a routine visit.  She has not visited a gynecologist in at least five years and plans to schedule an appointment within the next six to eight months. No gynecological issues such as abnormal bleeding or pain are present.  She is concerned about weight gain, noting an increase of about five pounds, bringing her weight to 142.2 pounds. She estimates her intake to be around 2000 calories a day and attributes the weight gain to decreased physical activity, as she is not walking as much as she used to.  She experiences occasional wrist pain, which she suspects may be arthritis-related. She is currently taking losartan  and uses Tylenol for pain relief, taking 500 to 1000 mg as needed. She has been advised to avoid NSAIDs.  She reports a prominent toe bone that is not painful but noticeable. She is on her feet a lot and is concerned about the appearance, though it does not cause discomfort.  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 The procedure took place in early August, and she is scheduled for a follow-up in March. She mentions a family history of cancer.  She has a family history of diabetes, with her mother having been on metformin for many years. She is vigilant about her own risk of developing type 2 diabetes and is trying to maintain a healthy lifestyle to avoid medication.  She has decided against receiving further COVID-19 vaccinations.   Flu vaccine administered today.   Colonoscopy notes requested from [facility].          Review of Systems:  Review of Systems  Constitutional: Negative.   HENT: Negative.    Eyes: Negative.   Respiratory: Negative.    Cardiovascular: Negative.   Gastrointestinal: Negative.   Genitourinary: Negative.   Musculoskeletal:  Positive for joint pain.  Skin: Negative.   Neurological: Negative.   Psychiatric/Behavioral: Negative.      Past Medical History:  Diagnosis Date   Elevated glucose    High cholesterol    Hyperlipidemia    Hypertension    Hypothyroidism    Lung nodules    Osteoporosis    Tingling in extremities    History reviewed. No pertinent surgical history. Social History:   reports that she quit smoking about 53 years ago. Her smoking use included cigarettes. She started smoking about 17 years ago. She does not have any smokeless tobacco history on file. She reports current alcohol use of about 4.0 standard drinks of alcohol per week. She  reports that she does not use drugs.  Family History  Problem Relation Age of Onset   Hypertension Mother    Diabetes Mother    Heart failure Father    CAD Father    Diabetes Father    Glaucoma Father     Medications: Patient's Medications  New Prescriptions   No medications on file  Previous Medications   CALCIUM CARB-CHOLECALCIFEROL (CALCIUM 600 + D PO)    Take 1 tablet by mouth daily at 12 noon.   HYDROCORTISONE 2.5 % OINTMENT    Apply 1 Application topically as needed.    LEVOTHYROXINE  (SYNTHROID ) 25 MCG TABLET    Take 25 mcg by mouth daily before breakfast.   LOSARTAN  (COZAAR ) 25 MG TABLET    TAKE 1 TABLET BY MOUTH ONCE DAILY   MULTIPLE VITAMINS-MINERALS (CENTRUM SILVER PO)    Take 1 tablet by mouth at bedtime.   OVER THE COUNTER MEDICATION    Take 2 Pieces by mouth daily. Super Beets  [employer] Flavor   ROSUVASTATIN (CRESTOR) 20 MG TABLET    TAKE 1 TABLET BY MOUTH EVERY DAY  Modified Medications   No medications on file  Discontinued Medications   No medications on file    Physical Exam:  Vitals:   07/18/24 0853  BP: (!) 120/100  Pulse: 62  Resp: 16  Temp: (!) 96.7 F (35.9 C)  SpO2: 95%  Weight: 145 lb (65.8 kg)  Height: 5' 5 (1.651 m)   Body mass index is 24.13 kg/m. Wt Readings from Last 3 Encounters:  07/18/24 145 lb (65.8 kg)  01/12/24 139 lb (63 kg)  01/11/24 140 lb (63.5 kg)    Physical Exam Vitals reviewed.  Constitutional:      General: She is not in acute distress. HENT:     Head: Normocephalic.  Eyes:     General:        Right eye: No discharge.        Left eye: No discharge.  Neck:     Thyroid : No thyroid  mass, thyromegaly or thyroid  tenderness.  Cardiovascular:     Rate and Rhythm: Normal rate and regular rhythm.     Pulses: Normal pulses.     Heart sounds: Normal heart sounds.  Pulmonary:     Effort: Pulmonary effort is normal. No respiratory distress.     Breath sounds: Normal breath sounds. No wheezing or rales.  Abdominal:     General: Bowel sounds are normal.     Palpations: Abdomen is soft.  Musculoskeletal:     Cervical back: Neck supple.     Right lower leg: No edema.     Left lower leg: No edema.     Right foot: Bunion present.  Lymphadenopathy:     Cervical: No cervical adenopathy.  Skin:    General: Skin is warm.     Capillary Refill: Capillary refill takes less than 2 seconds.  Neurological:     General: No focal deficit present.     Mental Status: She is alert and oriented to person,  place, and time.  Psychiatric:        Mood and Affect: Mood normal.     Labs reviewed: Basic Metabolic Panel: Recent Labs    11/17/23 0838 01/12/24 1135  NA 138  --   K 3.9  --   CL 100  --   CO2 29  --   GLUCOSE 98  --   BUN 23  --   CREATININE 0.86  --  CALCIUM 9.6  --   TSH 4.93* 3.130   Liver Function Tests: Recent Labs    11/17/23 0838  AST 18  ALT 13  BILITOT 0.5  PROT 7.0   No results for input(s): LIPASE, AMYLASE in the last 8760 hours. No results for input(s): AMMONIA in the last 8760 hours. CBC: Recent Labs    11/17/23 0838  WBC 5.9  NEUTROABS 2,862  HGB 14.7  HCT 43.7  MCV 92.4  PLT 260   Lipid Panel: Recent Labs    11/17/23 0838 01/12/24 1135  CHOL 248* 161  HDL 85 81  LDLCALC 147* 66  TRIG 66 72  CHOLHDL 2.9 2.0   TSH: Recent Labs    11/17/23 0838 01/12/24 1135  TSH 4.93* 3.130   A1C: No results found for: HGBA1C   Assessment/Plan 1. Immunization due (Primary) - Flu vaccine HIGH DOSE PF(Fluzone Trivalent)  2. Essential hypertension - uncontrolled, goal < 150/90 - on losartan   - home blood pressures x 2 weeks and report reading to PCP  - BUN/creat 23/0.86 11/2023 - Basic Metabolic Panel with eGFR  3. Mixed hyperlipidemia - total 161, LDL 66 01/12/2024 - cont rosuvastatin  4. Acquired hypothyroidism - TSH 3.130 01/12/2024 - weight gain 5 lbs  - cont levothyroxine   -  recheck TSH  5. Weight gain - see above - exercising less - recommend exercise 150 minutes/week  6. 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 Bunion of right foot - Asymptomatic  Total time: 35 minutes. Greater than 50% of total time spent doing patient education regarding health maintenance, HTN, HLD, hypothyroidism and weight gain including symptom/medication management.    Next appt: Visit date not found  Nial Hawe Gil BODILY  [facility] [phone]

## 2024-07-19 ENCOUNTER — Other Ambulatory Visit: Payer: Self-pay | Admitting: Orthopedic Surgery

## 2024-07-19 ENCOUNTER — Ambulatory Visit: Payer: Self-pay | Admitting: Orthopedic Surgery

## 2024-07-19 DIAGNOSIS — E039 Hypothyroidism, unspecified: Secondary | ICD-10-CM

## 2024-07-19 LAB — BASIC METABOLIC PANEL WITH GFR
BUN: 13 mg/dL (ref 7–25)
CO2: 26 mmol/L (ref 20–32)
Calcium: 9.7 mg/dL (ref 8.6–10.4)
Chloride: 106 mmol/L (ref 98–110)
Creat: 0.85 mg/dL (ref 0.60–1.00)
Glucose, Bld: 96 mg/dL (ref 65–99)
Potassium: 4.6 mmol/L (ref 3.5–5.3)
Sodium: 140 mmol/L (ref 135–146)
eGFR: 72 mL/min/1.73m2 (ref >=60)

## 2024-07-19 LAB — TSH: TSH: 3.51 m[IU]/L (ref 0.40–4.50)

## 2024-07-19 MED ORDER — LEVOTHYROXINE SODIUM 25 MCG PO TABS: 25.0000 ug | ORAL_TABLET | Freq: Every day | ORAL | 2 refills | Status: AC

## 2024-09-20 DIAGNOSIS — Z1231 Encounter for screening mammogram for malignant neoplasm of breast: Secondary | ICD-10-CM | POA: Diagnosis not present

## 2024-09-20 LAB — HM MAMMOGRAPHY

## 2024-10-09 ENCOUNTER — Other Ambulatory Visit: Payer: Self-pay | Admitting: Orthopedic Surgery

## 2024-10-09 DIAGNOSIS — I10 Essential (primary) hypertension: Secondary | ICD-10-CM

## 2024-12-04 ENCOUNTER — Encounter: Payer: Self-pay | Admitting: Orthopedic Surgery

## 2024-12-04 DIAGNOSIS — I10 Essential (primary) hypertension: Secondary | ICD-10-CM

## 2024-12-05 MED ORDER — LOSARTAN POTASSIUM 25 MG PO TABS: 50.0000 mg | ORAL_TABLET | Freq: Every day | ORAL | 0 refills | Status: AC

## 2024-12-19 ENCOUNTER — Ambulatory Visit: Admitting: Orthopedic Surgery

## 2025-01-16 ENCOUNTER — Ambulatory Visit: Payer: Self-pay | Admitting: Orthopedic Surgery
